# Patient Record
Sex: Female | Born: 1970
Health system: Southern US, Community
[De-identification: ages and names within clinical notes are randomized; demographics above are authoritative.]

## PROBLEM LIST (undated history)

## (undated) DIAGNOSIS — Z789 Other specified health status: Secondary | ICD-10-CM

## (undated) HISTORY — DX: Other specified health status: Z78.9

## (undated) HISTORY — PX: TUBAL LIGATION: SHX77

## (undated) HISTORY — PX: OTHER SURGICAL HISTORY: SHX169

## (undated) HISTORY — PX: DILATION AND CURETTAGE OF UTERUS: SHX78

---

## 1997-12-21 ENCOUNTER — Ambulatory Visit (HOSPITAL_COMMUNITY): Admission: RE | Admit: 1997-12-21 | Discharge: 1997-12-21 | Payer: Self-pay | Admitting: *Deleted

## 1998-03-17 ENCOUNTER — Ambulatory Visit (HOSPITAL_COMMUNITY): Admission: RE | Admit: 1998-03-17 | Discharge: 1998-03-17 | Payer: Self-pay | Admitting: *Deleted

## 1998-06-14 ENCOUNTER — Inpatient Hospital Stay (HOSPITAL_COMMUNITY): Admission: AD | Admit: 1998-06-14 | Discharge: 1998-06-17 | Payer: Self-pay | Admitting: *Deleted

## 1999-07-07 ENCOUNTER — Emergency Department (HOSPITAL_COMMUNITY): Admission: EM | Admit: 1999-07-07 | Discharge: 1999-07-07 | Payer: Self-pay | Admitting: Emergency Medicine

## 2001-02-04 ENCOUNTER — Emergency Department (HOSPITAL_COMMUNITY): Admission: EM | Admit: 2001-02-04 | Discharge: 2001-02-04 | Payer: Self-pay | Admitting: Emergency Medicine

## 2001-03-19 ENCOUNTER — Emergency Department (HOSPITAL_COMMUNITY): Admission: EM | Admit: 2001-03-19 | Discharge: 2001-03-19 | Payer: Self-pay

## 2001-11-04 ENCOUNTER — Emergency Department (HOSPITAL_COMMUNITY): Admission: EM | Admit: 2001-11-04 | Discharge: 2001-11-04 | Payer: Self-pay | Admitting: Emergency Medicine

## 2001-11-07 ENCOUNTER — Emergency Department (HOSPITAL_COMMUNITY): Admission: EM | Admit: 2001-11-07 | Discharge: 2001-11-07 | Payer: Self-pay | Admitting: *Deleted

## 2001-11-14 ENCOUNTER — Emergency Department (HOSPITAL_COMMUNITY): Admission: EM | Admit: 2001-11-14 | Discharge: 2001-11-14 | Payer: Self-pay | Admitting: Emergency Medicine

## 2003-01-07 ENCOUNTER — Emergency Department (HOSPITAL_COMMUNITY): Admission: EM | Admit: 2003-01-07 | Discharge: 2003-01-07 | Payer: Self-pay | Admitting: Emergency Medicine

## 2003-01-24 ENCOUNTER — Emergency Department (HOSPITAL_COMMUNITY): Admission: EM | Admit: 2003-01-24 | Discharge: 2003-01-24 | Payer: Self-pay | Admitting: Emergency Medicine

## 2005-03-18 ENCOUNTER — Emergency Department (HOSPITAL_COMMUNITY): Admission: EM | Admit: 2005-03-18 | Discharge: 2005-03-18 | Payer: Self-pay | Admitting: Emergency Medicine

## 2005-04-07 ENCOUNTER — Emergency Department (HOSPITAL_COMMUNITY): Admission: EM | Admit: 2005-04-07 | Discharge: 2005-04-07 | Payer: Self-pay | Admitting: Emergency Medicine

## 2007-05-20 ENCOUNTER — Encounter: Admission: RE | Admit: 2007-05-20 | Discharge: 2007-05-21 | Payer: Self-pay | Admitting: Internal Medicine

## 2007-05-21 ENCOUNTER — Encounter: Admission: RE | Admit: 2007-05-21 | Discharge: 2007-06-12 | Payer: Self-pay | Admitting: Internal Medicine

## 2010-05-10 ENCOUNTER — Emergency Department (HOSPITAL_COMMUNITY): Admission: EM | Admit: 2010-05-10 | Discharge: 2010-05-10 | Payer: Self-pay | Admitting: Emergency Medicine

## 2010-05-11 ENCOUNTER — Emergency Department (HOSPITAL_COMMUNITY): Admission: EM | Admit: 2010-05-11 | Discharge: 2010-05-11 | Payer: Self-pay | Admitting: Emergency Medicine

## 2010-10-26 LAB — CBC
HCT: 39.8 % (ref 36.0–46.0)
MCHC: 34 g/dL (ref 30.0–36.0)
Platelets: 278 10*3/uL (ref 150–400)
RDW: 12.8 % (ref 11.5–15.5)
WBC: 8.8 10*3/uL (ref 4.0–10.5)

## 2010-10-26 LAB — DIFFERENTIAL
Basophils Absolute: 0 10*3/uL (ref 0.0–0.1)
Basophils Relative: 1 % (ref 0–1)
Lymphocytes Relative: 28 % (ref 12–46)
Monocytes Absolute: 1 10*3/uL (ref 0.1–1.0)
Neutro Abs: 5.1 10*3/uL (ref 1.7–7.7)
Neutrophils Relative %: 58 % (ref 43–77)

## 2010-10-26 LAB — BASIC METABOLIC PANEL
BUN: 8 mg/dL (ref 6–23)
Calcium: 9.4 mg/dL (ref 8.4–10.5)
Creatinine, Ser: 0.54 mg/dL (ref 0.4–1.2)
GFR calc non Af Amer: >60 mL/min (ref >60)
Glucose, Bld: 95 mg/dL (ref 70–99)

## 2010-10-26 LAB — URINALYSIS, ROUTINE W REFLEX MICROSCOPIC
Bilirubin Urine: NEGATIVE
Ketones, ur: NEGATIVE mg/dL
Nitrite: NEGATIVE
Protein, ur: NEGATIVE mg/dL
Urobilinogen, UA: 1 mg/dL (ref 0.0–1.0)
pH: 6 (ref 5.0–8.0)

## 2010-10-26 LAB — POCT CARDIAC MARKERS
CKMB, poc: <1 ng/mL — ABNORMAL LOW (ref 1.0–8.0)
Troponin i, poc: <0.05 ng/mL (ref 0.00–0.09)

## 2011-12-25 ENCOUNTER — Inpatient Hospital Stay (HOSPITAL_COMMUNITY)
Admission: AD | Admit: 2011-12-25 | Discharge: 2011-12-25 | Disposition: A | Discharge status: Home / Self Care | Payer: Self-pay | Source: Ambulatory Visit | Attending: Obstetrics & Gynecology | Admitting: Obstetrics & Gynecology

## 2011-12-25 ENCOUNTER — Encounter (HOSPITAL_COMMUNITY): Payer: Self-pay | Admitting: *Deleted

## 2011-12-25 ENCOUNTER — Inpatient Hospital Stay (HOSPITAL_COMMUNITY): Payer: Self-pay

## 2011-12-25 DIAGNOSIS — R109 Unspecified abdominal pain: Secondary | ICD-10-CM | POA: Insufficient documentation

## 2011-12-25 DIAGNOSIS — N949 Unspecified condition associated with female genital organs and menstrual cycle: Secondary | ICD-10-CM | POA: Insufficient documentation

## 2011-12-25 DIAGNOSIS — R1084 Generalized abdominal pain: Secondary | ICD-10-CM

## 2011-12-25 HISTORY — DX: Other specified health status: Z78.9

## 2011-12-25 LAB — CBC
HCT: 37.6 % (ref 36.0–46.0)
MCH: 29.4 pg (ref 26.0–34.0)
MCV: 89.7 fL (ref 78.0–100.0)
RBC: 4.19 MIL/uL (ref 3.87–5.11)
RDW: 12.6 % (ref 11.5–15.5)
WBC: 9.7 10*3/uL (ref 4.0–10.5)

## 2011-12-25 LAB — WET PREP, GENITAL
Clue Cells Wet Prep HPF POC: NONE SEEN
Trich, Wet Prep: NONE SEEN
Yeast Wet Prep HPF POC: NONE SEEN

## 2011-12-25 LAB — DIFFERENTIAL
Eosinophils Absolute: 0.2 10*3/uL (ref 0.0–0.7)
Eosinophils Relative: 2 % (ref 0–5)
Lymphocytes Relative: 27 % (ref 12–46)
Lymphs Abs: 2.6 10*3/uL (ref 0.7–4.0)
Monocytes Absolute: 1.1 10*3/uL — ABNORMAL HIGH (ref 0.1–1.0)

## 2011-12-25 LAB — URINALYSIS, ROUTINE W REFLEX MICROSCOPIC
Bilirubin Urine: NEGATIVE
Hgb urine dipstick: NEGATIVE
Ketones, ur: NEGATIVE mg/dL
Specific Gravity, Urine: 1.025 (ref 1.005–1.030)
Urobilinogen, UA: 0.2 mg/dL (ref 0.0–1.0)

## 2011-12-25 MED ORDER — DICLOFENAC SODIUM 75 MG PO TBEC: 75.0000 mg | DELAYED_RELEASE_TABLET | Freq: Two times a day (BID) | ORAL | Status: DC

## 2011-12-25 MED ORDER — IBUPROFEN 800 MG PO TABS
800.0000 mg | ORAL_TABLET | Freq: Once | ORAL | Status: AC
  Administered 2011-12-25: 800 mg via ORAL
  Filled 2011-12-25: qty 1

## 2011-12-25 NOTE — MAU Provider Note (Addendum)
History     CSN: [id number]  Arrival date & time 12/25/11  1724   None     Chief Complaint  Patient presents with  . Abdominal Pain    HPI Jaclyn Wright is a 41 y.o. female who presents to MAU for lower abdominal pain that has been off and on x 3 weeks. Has gotten worse the past few days on the right side. Denies vaginal bleeding, no nausea or vomiting. BTL 13 years ago and then in December 2012 had tubal reversal because she had so much abdominal bloating and cramping pain. Done in [place], [place]. No birth control at this time. No period since Feb.  Last pap smear 3 years ago and was normal. No regular GYN since Dr. Wiliam Ke passed away. Current sex partner x 14 years. No history of STI's. The history was provided by the patient.   Past Medical History  Diagnosis Date  . No pertinent past medical history     Past Surgical History  Procedure Date  . Cesarean section   . Tubal ligation   . Tubal reversal     Family History  Problem Relation Age of Onset  . Anesthesia problems Neg Hx     History  Substance Use Topics  . Smoking status: Former Games developer  . Smokeless tobacco: Not on file   Comment: quit with pregnancy  . Alcohol Use: No    OB History    Grav Para Term Preterm Abortions TAB SAB Ect Mult Living   5 4 4  0 1 1 0 0 0 4      Review of Systems  Constitutional: Negative for fever, chills, diaphoresis and fatigue.  HENT: Negative for ear pain, congestion, sore throat, facial swelling, neck pain, neck stiffness, dental problem and sinus pressure.   Eyes: Negative for photophobia, pain, discharge and visual disturbance.  Respiratory: Negative for cough, chest tightness and wheezing.   Cardiovascular: Negative.  Negative for chest pain and leg swelling.  Gastrointestinal: Positive for abdominal pain and constipation. Negative for nausea, vomiting, diarrhea and abdominal distention.  Genitourinary: Positive for frequency. Negative for dysuria, urgency, flank pain, vaginal  bleeding, vaginal discharge and difficulty urinating.  Musculoskeletal: Positive for back pain. Negative for myalgias and gait problem.  Skin: Negative for color change and rash.  Neurological: Positive for headaches. Negative for dizziness, speech difficulty, weakness, light-headedness and numbness.  Psychiatric/Behavioral: Negative for confusion and agitation. The patient is not nervous/anxious.     Allergies  Review of patient's allergies indicates no known allergies.  Home Medications  No current outpatient prescriptions on file.  BP 135/85  Pulse 97  Temp(Src) 98.4 F (36.9 C) (Oral)  Resp 20  Ht 5' 5.5" (1.664 m)  Wt 240 lb 12.8 oz (109.226 kg)  BMI 39.46 kg/m2  SpO2 100%  LMP 09/22/2011  Physical Exam  Nursing note and vitals reviewed. Constitutional: She is oriented to person, place, and time. She appears well-developed and well-nourished. No distress.  HENT:  Head: Normocephalic.  Eyes: EOM are normal.  Neck: Neck supple.  Cardiovascular: Normal rate.   Pulmonary/Chest: Effort normal.  Abdominal: Soft. There is no tenderness.  Genitourinary:       External genitalia without lesions. White discharge vaginal vault. No CMT, right adnexal tenderness. Unable to palpate uterus due to patient habitus.  Musculoskeletal: Normal range of motion.  Neurological: She is alert and oriented to person, place, and time. No cranial nerve deficit.  Skin: Skin is warm and dry.  Psychiatric: She  has a normal mood and affect. Her behavior is normal. Judgment and thought content normal.   Results for orders placed during the hospital encounter of 12/25/11 (from the past 24 hour(s))  URINALYSIS, ROUTINE W REFLEX MICROSCOPIC     Status: Normal   Collection Time   12/25/11  5:45 PM      Component Value Range   Color, Urine YELLOW  YELLOW    APPearance CLEAR  CLEAR    Specific Gravity, Urine 1.025  1.005 - 1.030    pH 6.0  5.0 - 8.0    Glucose, UA NEGATIVE  NEGATIVE (mg/dL)   Hgb  urine dipstick NEGATIVE  NEGATIVE    Bilirubin Urine NEGATIVE  NEGATIVE    Ketones, ur NEGATIVE  NEGATIVE (mg/dL)   Protein, ur NEGATIVE  NEGATIVE (mg/dL)   Urobilinogen, UA 0.2  0.0 - 1.0 (mg/dL)   Nitrite NEGATIVE  NEGATIVE    Leukocytes, UA NEGATIVE  NEGATIVE   POCT PREGNANCY, URINE     Status: Normal   Collection Time   12/25/11  5:59 PM      Component Value Range   Preg Test, Ur NEGATIVE  NEGATIVE   CBC     Status: Normal   Collection Time   12/25/11  6:40 PM      Component Value Range   WBC 9.7  4.0 - 10.5 (K/uL)   RBC 4.19  3.87 - 5.11 (MIL/uL)   Hemoglobin 12.3  12.0 - 15.0 (g/dL)   HCT 44.0  34.7 - 42.5 (%)   MCV 89.7  78.0 - 100.0 (fL)   MCH 29.4  26.0 - 34.0 (pg)   MCHC 32.7  30.0 - 36.0 (g/dL)   RDW 95.6  38.7 - 56.4 (%)   Platelets 278  150 - 400 (K/uL)  DIFFERENTIAL     Status: Abnormal   Collection Time   12/25/11  6:40 PM      Component Value Range   Neutrophils Relative 60  43 - 77 (%)   Neutro Abs 5.8  1.7 - 7.7 (K/uL)   Lymphocytes Relative 27  12 - 46 (%)   Lymphs Abs 2.6  0.7 - 4.0 (K/uL)   Monocytes Relative 11  3 - 12 (%)   Monocytes Absolute 1.1 (*) 0.1 - 1.0 (K/uL)   Eosinophils Relative 2  0 - 5 (%)   Eosinophils Absolute 0.2  0.0 - 0.7 (K/uL)   Basophils Relative 0  0 - 1 (%)   Basophils Absolute 0.0  0.0 - 0.1 (K/uL)  HCG, QUANTITATIVE, PREGNANCY     Status: Normal   Collection Time   12/25/11  6:40 PM      Component Value Range   hCG, Beta Chain, Quant, S <1  <5 (mIU/mL)  WET PREP, GENITAL     Status: Abnormal   Collection Time   12/25/11  7:25 PM      Component Value Range   Yeast Wet Prep HPF POC NONE SEEN  NONE SEEN    Trich, Wet Prep NONE SEEN  NONE SEEN    Clue Cells Wet Prep HPF POC NONE SEEN  NONE SEEN    WBC, Wet Prep HPF POC FEW (*) NONE SEEN    Assessment: Abdominal pain/pelvic pain  Plan:  Care turned over to [facility], CNM @ 20:00 pm. Patient awaiting ultrasound. ED Course  Procedures  RADIOLOGY REPORT*  Clinical  Data: Pelvic pain, right side greater than left. Right  adnexal tenderness. Amenorrhea. Negative pregnancy test.  TRANSABDOMINAL AND TRANSVAGINAL ULTRASOUND OF PELVIS  Technique: Both transabdominal and transvaginal ultrasound  examinations of the pelvis were performed. Transabdominal  technique was performed for global imaging of the pelvis including  uterus, ovaries, adnexal regions, and pelvic cul-de-sac.  It was necessary to proceed with endovaginal exam following the  transabdominal exam to visualize the endometrium and ovaries.  Comparison: None.  Findings:  Uterus: 9.4 x 5.3 x 6.2 cm. No fibroids or other uterine mass  identified. Prior C-section scar noted.  Endometrium: Double layer thickness measures 12 mm transvaginally.  Normal appearance.  Right ovary: 4.7 x 3.2 x 3.2 cm. Normal appearance. No ovarian or  adnexal mass identified.  Left ovary: 3.3 x 2.0 x 2.2 cm. Normal appearance. No ovarian or  adnexal mass identified.  Other Findings: No free fluid  IMPRESSION:  Normal study. No evidence of pelvic mass or other significant  abnormality.  Original Report Authenticated By: Danae Orleans, M.D.  MDM   Pelvic Pain  Discharge home Rx Diclofenac prn FU with Gyn Provider of your choice [facility] E.  10:03 PM 12/25/2011

## 2011-12-25 NOTE — MAU Note (Signed)
Pt in c/o right sided lower abdominal cramping pain.  Had tubal reversal in December 2012 followed by 2 normal periods.  Has not had period since February 9th.  Reports an increased clear vaginal discharge without odor.  Taken multiple preg tests at home, all negative.

## 2011-12-25 NOTE — MAU Provider Note (Signed)
Attestation of Attending Supervision of Advanced Practitioner: Evaluation and management procedures were performed by the OB Fellow/PA/CNM/NP under my supervision and collaboration. Chart reviewed, and agree with management and plan.  Carollee Nussbaumer, M.D. 12/25/2011 10:38 PM   

## 2011-12-25 NOTE — MAU Provider Note (Signed)
Attestation of Attending Supervision of Advanced Practitioner: Evaluation and management procedures were performed by the [facility] Fellow/PA/CNM/NP under my supervision and collaboration. Chart reviewed, and agree with management and plan.  Jaynie Collins, M.D. 12/25/2011 8:37 PM

## 2011-12-25 NOTE — MAU Note (Signed)
Patient states she had her tubes tied during a cesarean section 13 years ago. On December 21-2012 due to having multiple problems after her tubes were tied she had a tubal reversal in [place], [place]. . Has not had a period since 2-9 and has had negative home pregnancy tests. Has had right side pain on and off for about one month. No bleeding but a heavier than usual discharge.

## 2011-12-25 NOTE — Discharge Instructions (Signed)
Abdominal Pain Abdominal pain can be caused by many things. Your caregiver decides the seriousness of your pain by an examination and possibly blood tests and X-rays. Many cases can be observed and treated at home. Most abdominal pain is not caused by a disease and will probably improve without treatment. However, in many cases, more time must pass before a clear cause of the pain can be found. Before that point, it may not be known if you need more testing, or if hospitalization or surgery is needed. HOME CARE INSTRUCTIONS   Do not take laxatives unless directed by your caregiver.   Take pain medicine only as directed by your caregiver.   Only take over-the-counter or prescription medicines for pain, discomfort, or fever as directed by your caregiver.   Try a clear liquid diet (broth, tea, or water) for as long as directed by your caregiver. Slowly move to a bland diet as tolerated.  SEEK IMMEDIATE MEDICAL CARE IF:   The pain does not go away.   You have a fever.   You keep throwing up (vomiting).   The pain is felt only in portions of the abdomen. Pain in the right side could possibly be appendicitis. In an adult, pain in the left lower portion of the abdomen could be colitis or diverticulitis.   You pass bloody or black tarry stools.  MAKE SURE YOU:   Understand these instructions.   Will watch your condition.   Will get help right away if you are not doing well or get worse.  Document Released: 05/09/2005 Document Revised: 07/19/2011 Document Reviewed: 03/17/2008 [facility] Patient Information 2012 [place], [place]. RESOURCE GUIDE  Dental Problems  Patients with Medicaid: [facility]                     [phone] [address]                                           Phone:  [phone]                                                  If unable to pay or uninsured, contact:  [facility] or [facility]. to become qualified for the adult dental  clinic.  Chronic Pain Problems Contact [facility]  [phone] Patients need to be referred by their primary care doctor.  Insufficient Money for Medicine Contact [employer] Way:  call "211" or [facility] [phone].  No Primary Care Doctor Call [facility]  [phone] Other agencies that provide inexpensive medical care    [facility]  [phone]    [facility]  [phone]    [facility]  [phone]    [facility]  [phone]    [facility]  [phone]    [facility]  [phone]  Substance Abuse Resources [facility]  ([phone] [facility] [phone] [facility] [place] [phone] Floydene Flock [phone] Residential & Outpatient Substance Abuse Program  [phone]  Psychological Services [facility]  [phone] [facility]  [phone] [facility]   [phone] (emergency services [phone])  Abuse/Neglect [facility] Child Abuse Hotline (505)495-9742 [facility]  Child Abuse Hotline (304)261-8281 (After Hours)  Emergency Shelter [facility] Minist[facility] 908-594-5259  M[facility] [phone]) [phone] [facility] [phone]  MRSA Hotline #:   754 583 3375    [facility] Resources  [facility]  [employer]                           [facility] Dept. [address] [place]                     [address]         [address] [place] [address]  Blondell Reveal Phone:  [phone]                                  Phone:  [phone]                   Phone:  [phone]  [facility] Phone:  [phone]  [facility] Child Abuse Hotline (671)208-8492 601-596-5778 (After Hours) [facility]    [facility]  [phone]     Phone: [phone]          [facility]                      [facility]  @[place]     Phone: [phone]  Phone: [phone]         [facility]     Phone: [phone]  Phone: [phone]           [facility] [place]                hone: [phone]  Phone: [phone]         [facility] [address]ise Ceo      Phone: [phone]  Phone: [phone]         [facility] Dept.                Phone: [phone]  Women's Health   Phone:[phone]    Family [address] [place])          Phone: [phone] [facility]   Phone: [phone]

## 2012-03-24 ENCOUNTER — Emergency Department (HOSPITAL_COMMUNITY): Payer: Self-pay

## 2012-03-24 ENCOUNTER — Emergency Department (HOSPITAL_COMMUNITY)
Admission: EM | Admit: 2012-03-24 | Discharge: 2012-03-24 | Disposition: A | Discharge status: Home / Self Care | Payer: Self-pay | Attending: Emergency Medicine | Admitting: Emergency Medicine

## 2012-03-24 ENCOUNTER — Encounter (HOSPITAL_COMMUNITY): Payer: Self-pay | Admitting: Emergency Medicine

## 2012-03-24 DIAGNOSIS — R1031 Right lower quadrant pain: Secondary | ICD-10-CM | POA: Insufficient documentation

## 2012-03-24 DIAGNOSIS — Z87891 Personal history of nicotine dependence: Secondary | ICD-10-CM | POA: Insufficient documentation

## 2012-03-24 DIAGNOSIS — N898 Other specified noninflammatory disorders of vagina: Secondary | ICD-10-CM | POA: Insufficient documentation

## 2012-03-24 DIAGNOSIS — R109 Unspecified abdominal pain: Secondary | ICD-10-CM

## 2012-03-24 LAB — URINALYSIS, ROUTINE W REFLEX MICROSCOPIC
Bilirubin Urine: NEGATIVE
Ketones, ur: NEGATIVE mg/dL
Nitrite: NEGATIVE
pH: 5.5 (ref 5.0–8.0)

## 2012-03-24 LAB — CBC WITH DIFFERENTIAL/PLATELET
Lymphocytes Relative: 35 % (ref 12–46)
Lymphs Abs: 2.5 10*3/uL (ref 0.7–4.0)
Neutro Abs: 3.8 10*3/uL (ref 1.7–7.7)
Neutrophils Relative %: 53 % (ref 43–77)
Platelets: 310 10*3/uL (ref 150–400)
RBC: 4.34 MIL/uL (ref 3.87–5.11)
WBC: 7.2 10*3/uL (ref 4.0–10.5)

## 2012-03-24 LAB — COMPREHENSIVE METABOLIC PANEL
ALT: 11 U/L (ref 0–35)
Alkaline Phosphatase: 56 U/L (ref 39–117)
CO2: 25 mEq/L (ref 19–32)
Chloride: 104 mEq/L (ref 96–112)
GFR calc Af Amer: >90 mL/min (ref >90)
Glucose, Bld: 96 mg/dL (ref 70–99)
Potassium: 3.9 mEq/L (ref 3.5–5.1)
Sodium: 138 mEq/L (ref 135–145)
Total Protein: 7.7 g/dL (ref 6.0–8.3)

## 2012-03-24 LAB — WET PREP, GENITAL: Trich, Wet Prep: NONE SEEN

## 2012-03-24 MED ORDER — SODIUM CHLORIDE 0.9 % IV SOLN
1000.0000 mL | INTRAVENOUS | Status: DC
  Administered 2012-03-24: 1000 mL via INTRAVENOUS

## 2012-03-24 MED ORDER — METRONIDAZOLE 500 MG PO TABS: 500.0000 mg | ORAL_TABLET | Freq: Two times a day (BID) | ORAL | Status: AC

## 2012-03-24 MED ORDER — IBUPROFEN 800 MG PO TABS
800.0000 mg | ORAL_TABLET | Freq: Once | ORAL | Status: AC
  Administered 2012-03-24: 800 mg via ORAL
  Filled 2012-03-24: qty 1

## 2012-03-24 MED ORDER — ONDANSETRON HCL 4 MG/2ML IJ SOLN: 4.0000 mg | Freq: Once | INTRAMUSCULAR | Status: DC

## 2012-03-24 MED ORDER — IOHEXOL 300 MG/ML  SOLN
100.0000 mL | Freq: Once | INTRAMUSCULAR | Status: AC | PRN
  Administered 2012-03-24: 100 mL via INTRAVENOUS

## 2012-03-24 NOTE — ED Notes (Signed)
Patient transported to CT 

## 2012-03-24 NOTE — ED Provider Notes (Signed)
History     CSN: [id number]  Arrival date & time 03/24/12  1156   First MD Initiated Contact with Patient 03/24/12 1232      Chief Complaint  Patient presents with  . Abdominal Pain    (Consider location/radiation/quality/duration/timing/severity/associated sxs/prior treatment) Patient is a 41 y.o. female presenting with abdominal pain. The history is provided by the patient. No language interpreter was used.  Abdominal Pain The primary symptoms of the illness include abdominal pain, fever and vaginal discharge. The primary symptoms of the illness do not include shortness of breath, vomiting, diarrhea, dysuria or vaginal bleeding. The current episode started more than 2 days ago. The onset of the illness was gradual.  The vaginal discharge is not associated with dysuria.  Symptoms associated with the illness do not include urgency, hematuria or back pain.   41 year old female coming in today with right lower quadrant abdominal pain times several months intermittently. Patient states that she went to [facility] hospital in May and had an ultrasound which was unremarkable. 3 weeks ago she went to Dr. blunt to put her on some kind of antibiotic for a vaginal discharge. States that she had positive yeast and positive bacterial infection and a "salty taste" according to her partner down in her privates.  Patient states that she did not have any STDs and her partner does not either. States that she has had a vaginal discharge that is clear. Past medical history of a tubal reversal in December. Patient does want to be pregnant she is 41 years old. Last period was 02/24/2012. Patient has one partner x13 years. States she may have had some fevers intermittently as well. She has taken Advil with some relief. States that she has nausea but no vomiting and no diarrhea.  Past Medical History  Diagnosis Date  . No pertinent past medical history     Past Surgical History  Procedure Date  . Cesarean  section   . Tubal ligation   . Tubal reversal     Family History  Problem Relation Age of Onset  . Anesthesia problems Neg Hx     History  Substance Use Topics  . Smoking status: Former Games developer  . Smokeless tobacco: Not on file   Comment: quit with pregnancy  . Alcohol Use: No    OB History    Grav Para Term Preterm Abortions TAB SAB Ect Mult Living   5 4 4  0 1 1 0 0 0 4      Review of Systems  Constitutional: Positive for fever.       Subjective fever  Respiratory: Negative for shortness of breath.   Gastrointestinal: Positive for abdominal pain. Negative for vomiting and diarrhea.  Genitourinary: Positive for vaginal discharge. Negative for dysuria, urgency, hematuria, flank pain, vaginal bleeding, difficulty urinating, vaginal pain and pelvic pain.  Musculoskeletal: Negative for back pain and gait problem.  All other systems reviewed and are negative.    Allergies  Review of patient's allergies indicates no known allergies.  Home Medications   Current Outpatient Rx  Name Route Sig Dispense Refill  . BC HEADACHE POWDER PO Oral Take 1 packet by mouth 3 (three) times daily as needed. For headache or pain    . IBUPROFEN 200 MG PO TABS Oral Take 200 mg by mouth every 6 (six) hours as needed. pain      BP 147/78  Pulse 82  Temp 98.9 F (37.2 C) (Oral)  Resp 20  SpO2 99%  LMP 02/27/2012  Physical Exam  Nursing note and vitals reviewed. Constitutional: She is oriented to person, place, and time. She appears well-developed and well-nourished.  HENT:  Head: Normocephalic and atraumatic.  Eyes: Conjunctivae and EOM are normal. Pupils are equal, round, and reactive to light.  Neck: Normal range of motion. Neck supple.  Cardiovascular: Normal rate, regular rhythm, normal heart sounds and intact distal pulses.   Pulmonary/Chest: Effort normal and breath sounds normal.  Abdominal: Soft. Bowel sounds are normal.  Genitourinary: Uterus normal. Pelvic exam was  performed with patient supine. Cervix exhibits discharge. Cervix exhibits no motion tenderness and no friability. Right adnexum displays no mass, no tenderness and no fullness. Left adnexum displays no mass, no tenderness and no fullness. No erythema, tenderness or bleeding around the vagina. No foreign body around the vagina. Vaginal discharge found.  Musculoskeletal: Normal range of motion. She exhibits no edema and no tenderness.  Neurological: She is alert and oriented to person, place, and time. She has normal reflexes.  Skin: Skin is warm and dry.  Psychiatric: She has a normal mood and affect.    ED Course  Pelvic exam Date/Time: 03/24/2012 3:52 AM Performed by: Remi Haggard Authorized by: Remi Haggard Consent: Verbal consent obtained. Written consent not obtained. Risks and benefits: risks, benefits and alternatives were discussed Consent given by: patient Patient understanding: patient states understanding of the procedure being performed Patient identity confirmed: verbally with patient, arm band, provided demographic data and hospital-assigned identification number Time out: Immediately prior to procedure a "time out" was called to verify the correct patient, procedure, equipment, support staff and site/side marked as required. Preparation: Patient was prepped and draped in the usual sterile fashion. Local anesthesia used: no Patient sedated: no Patient tolerance: Patient tolerated the procedure well with no immediate complications.   (including critical care time)  Labs Reviewed  URINALYSIS, ROUTINE W REFLEX MICROSCOPIC - Abnormal; Notable for the following:    APPearance CLOUDY (*)     All other components within normal limits  POCT PREGNANCY, URINE  CBC WITH DIFFERENTIAL  COMPREHENSIVE METABOLIC PANEL  WET PREP, GENITAL  GC/CHLAMYDIA PROBE AMP, GENITAL   No results found.   No diagnosis found.    MDM  RLQ pain with vaginal discharge.  Will Treat for BV.  CT  abdomen unremarkable.  Patient will follow up at pcp gyn of choice. Ibuprofen for pain.       Remi Haggard, NP 03/25/12 [phone]

## 2012-03-24 NOTE — ED Notes (Signed)
Pt presenting to ed with c/o abdominal pain x months pt states pain is worse today. Pt states positive nausea no vomiting pt states last normal bowel movement yesterday. Pt denies diarrhea at this time

## 2012-03-25 LAB — GC/CHLAMYDIA PROBE AMP, GENITAL
Chlamydia, DNA Probe: NEGATIVE
GC Probe Amp, Genital: NEGATIVE

## 2012-03-27 NOTE — ED Provider Notes (Signed)
Medical screening examination/treatment/procedure(s) were conducted as a shared visit with non-physician practitioner(s) and myself.  I personally evaluated the patient during the encounter  Derwood Kaplan, MD 03/27/12 1710

## 2013-10-01 IMAGING — CT CT ABD-PELV W/ CM
1 of 3 series · 13 of 32 positions shown, 18 images · IV contrast (OMNIPAQUE 300)
Comparison: Pelvic ultrasound - 12/25/2011

CLINICAL DATA: Abdominal pain for several months, worse today with
nausea

CT ABDOMEN AND PELVIS WITH CONTRAST
TECHNIQUE: Multidetector CT imaging of the abdomen and pelvis was
performed following the standard protocol during bolus
administration of intravenous contrast.
Contrast: 100mL OMNIPAQUE IOHEXOL 300 MG/ML  SOLN

[Series 2: abd/pel with · axial · 0.74mm/px · z∈[-495,-110]mm · 13 of 87 slices shown, 18 images]
[im 5/87  soft-tissue]
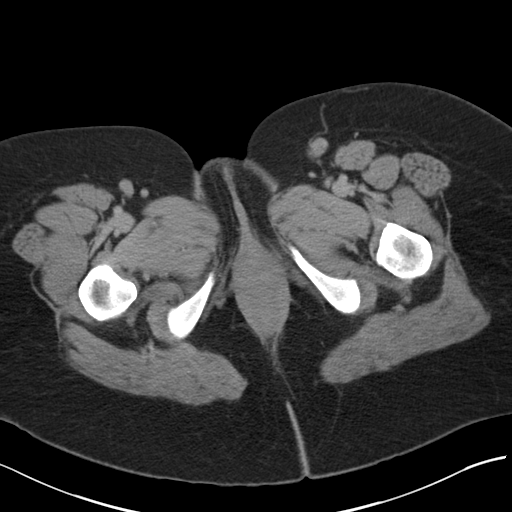
[im 5/87  bone]
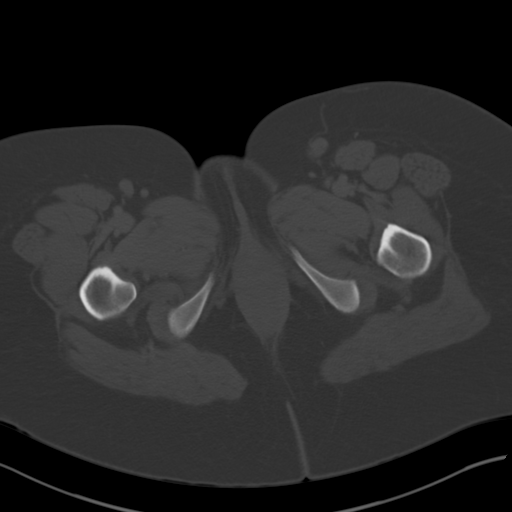
[im 15/87  soft-tissue]
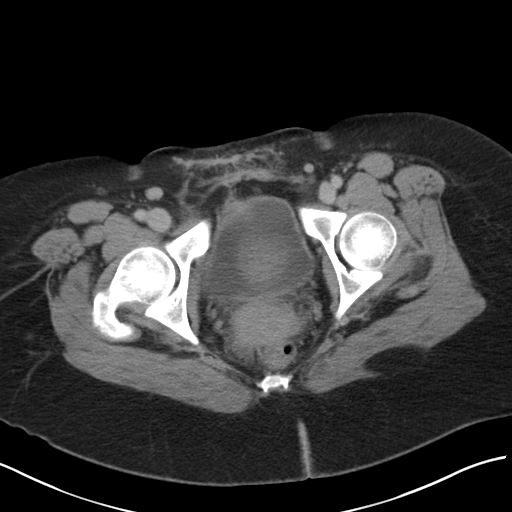
[im 20/87  soft-tissue]
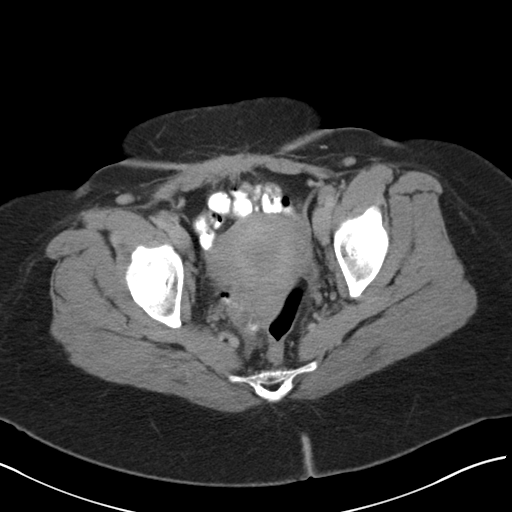
[im 24/87  soft-tissue]
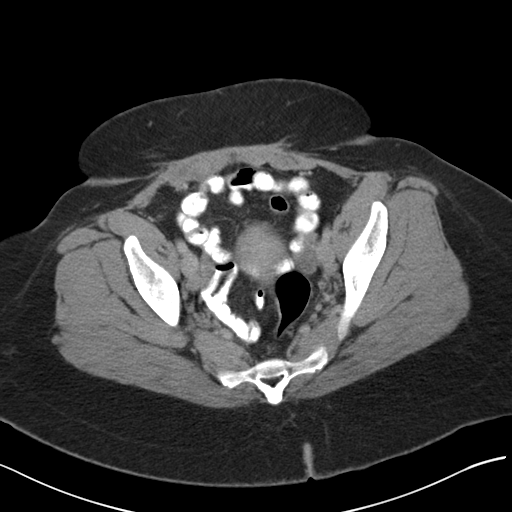
[im 34/87  soft-tissue]
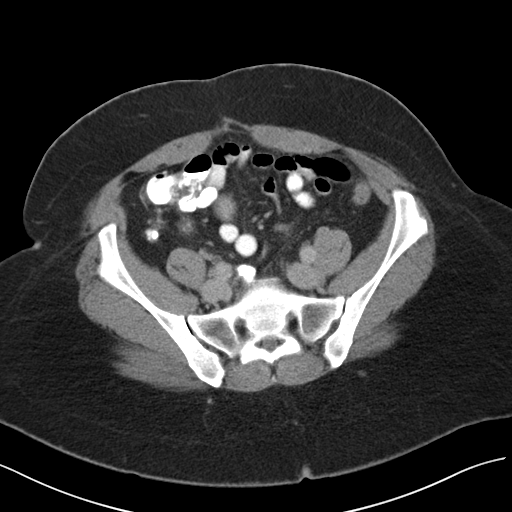
[im 39/87  soft-tissue]
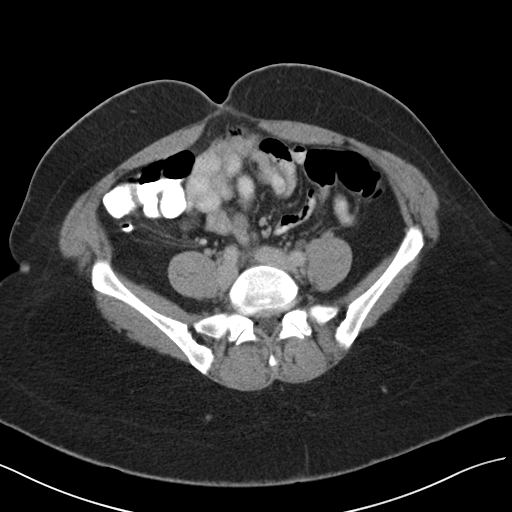
[im 48/87  soft-tissue]
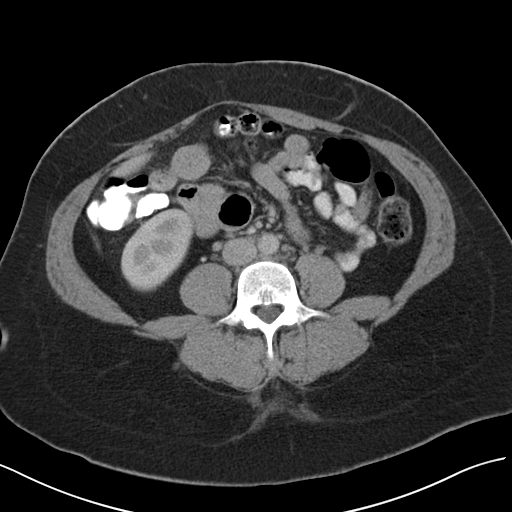
[im 53/87  soft-tissue]
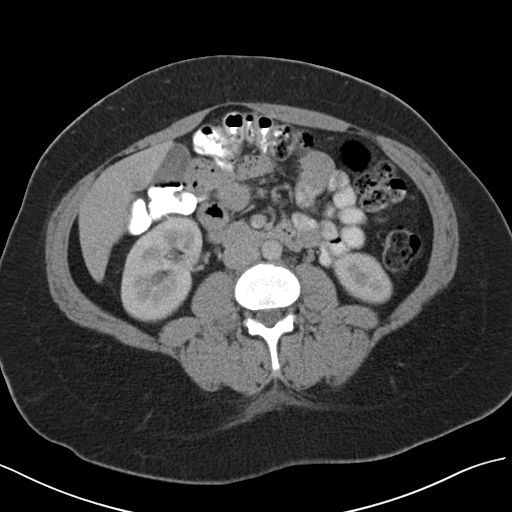
[im 63/87  soft-tissue]
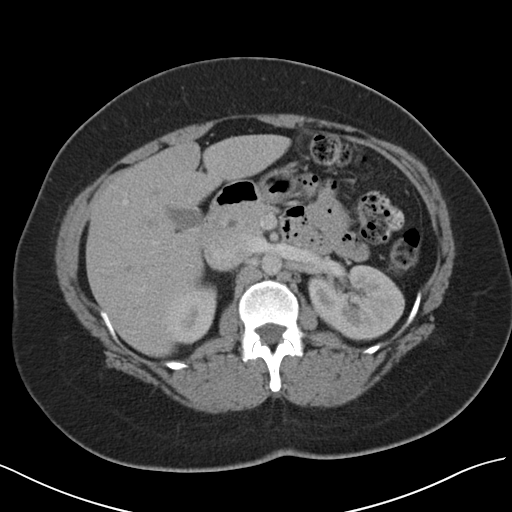
[im 63/87  bone]
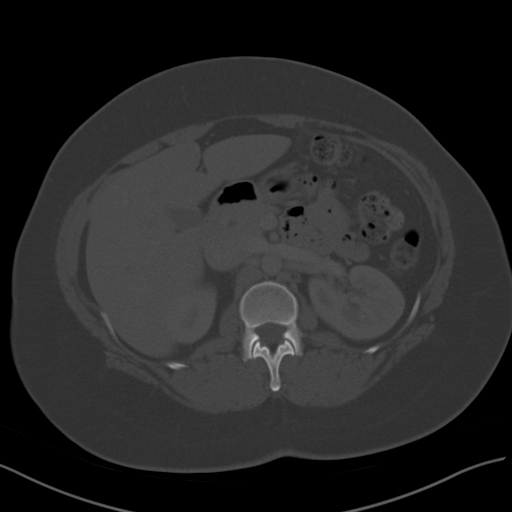
[im 67/87  soft-tissue]
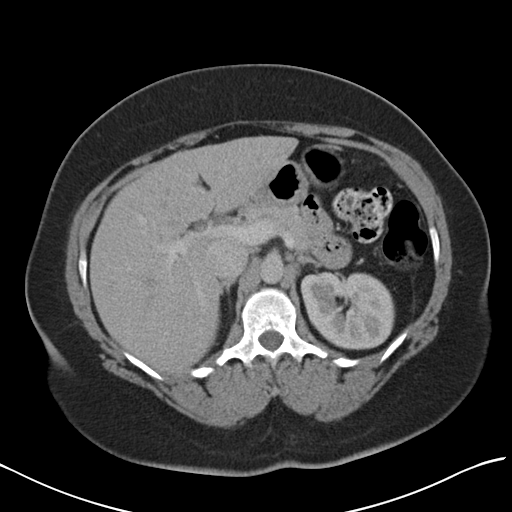
[im 67/87  lung]
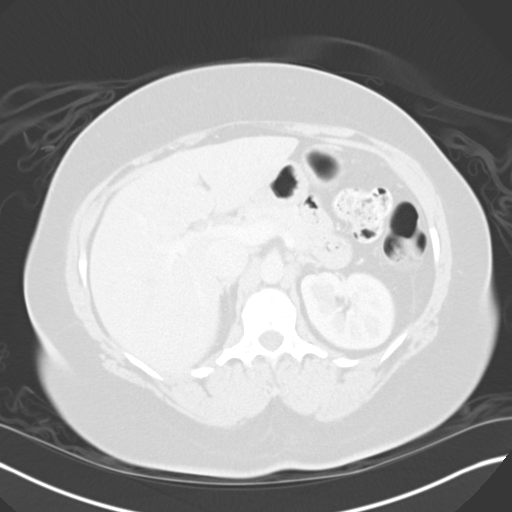
[im 72/87  soft-tissue]
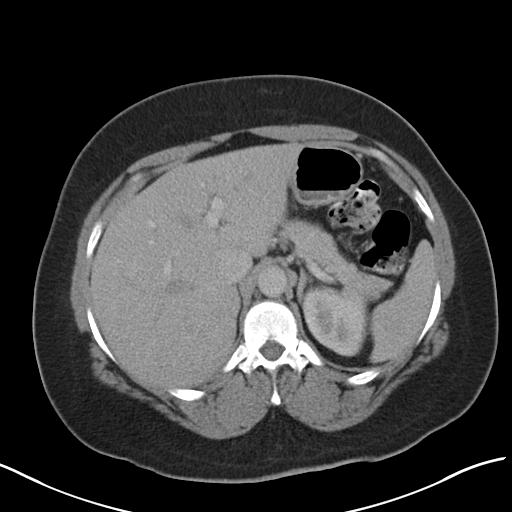
[im 72/87  lung]
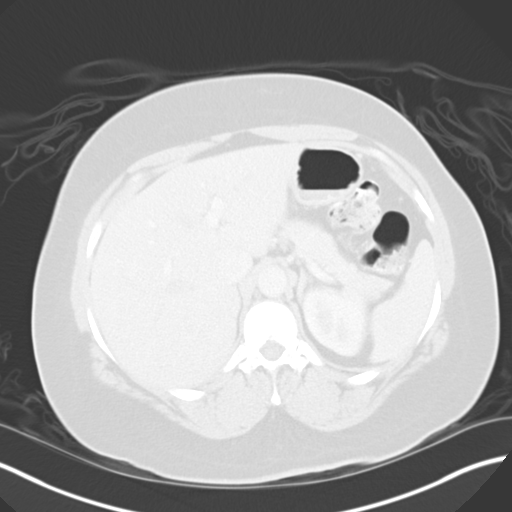
[im 77/87  lung]
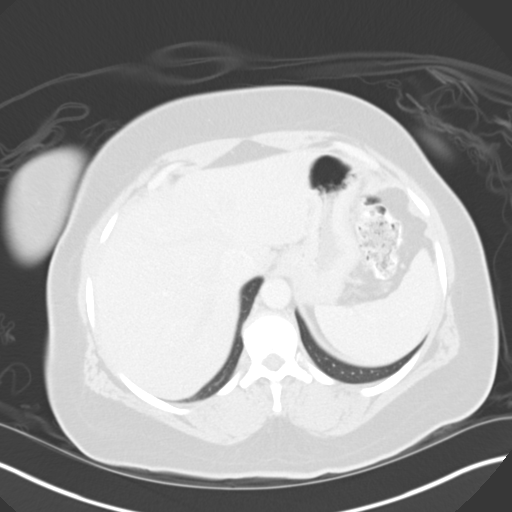
[im 82/87  soft-tissue]
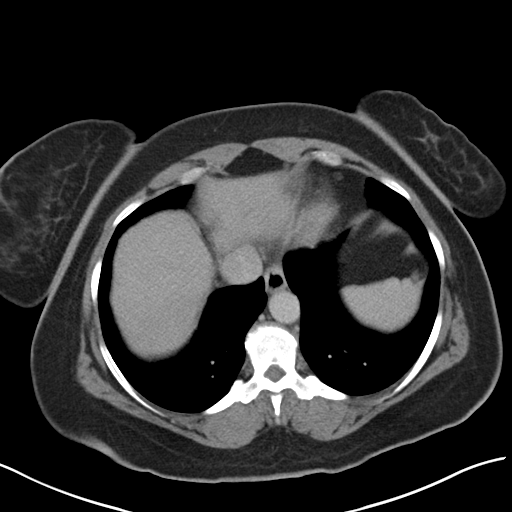
[im 82/87  lung]
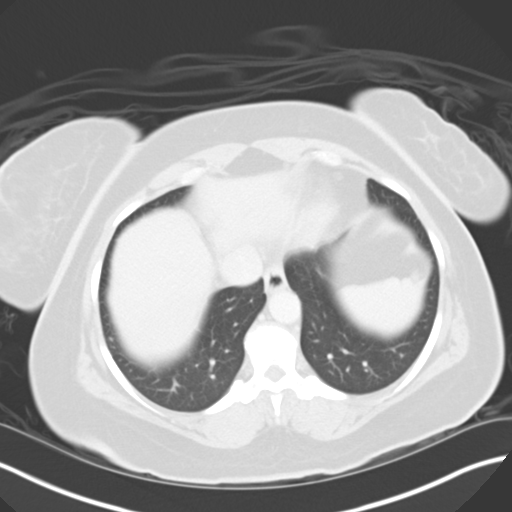

[13 of 32 positions shown; findings below may reference images not displayed]

FINDINGS: Normal hepatic contour.  No discrete hepatic lesions.  Normal
appearance of the gallbladder.  No ascites.  There is symmetric
enhancement and excretion of the bilateral kidneys.  No discrete
renal stones on this postcontrast examination.  No urinary
obstruction or perinephric stranding.  Normal appearance of the
bilateral adrenal glands, pancreas and spleen.

Ingested enteric contrast extends to the level of the transverse
colon.  No evidence of enteric obstruction.  Scattered minimal
colonic diverticulosis without evidence of diverticulitis.  The
bowel is otherwise normal in course and caliber without wall
thickening or evidence of obstruction.  Normal appearance of the
retrocecal appendix.  No pneumoperitoneum, pneumatosis or portal
venous gas.

Normal caliber of the abdominal aorta.  The major branch vessels of
the abdominal aorta are patent.  Scattered shoddy retroperitoneal
lymph nodes are not enlarged by CT criteria.

No retroperitoneal, or mesenteric adenopathy. Grossly symmetric
bilateral external iliac chain and inguinal lymph nodes are
presumably reactive in etiology.  This finding is associated with
minimal stranding within the undersurface of the patient's pannus.

There is a minimal amount of free fluid within endometrial canal,
presumably physiologic.  No discrete adnexal lesion.  No free fluid
in the pelvis.

Limited visualization of the lower thorax is negative for focal
airspace opacity or pleural effusion.  Normal heart size.  No
pericardial effusion.  Incidental note is made of a prominent
pericardial lymph node measuring 5 mm in short axis diameter (image
6, series 2), likely reactive.

No acute or aggressive osseous abnormalities.
IMPRESSION: 1.  No explanation for patient's abdominal pain.  Specifically, no
evidence of enteric or urinary obstruction.
2.  Colonic diverticulosis without evidence of diverticulitis.

## 2013-12-16 ENCOUNTER — Emergency Department (INDEPENDENT_AMBULATORY_CARE_PROVIDER_SITE_OTHER)
Admission: EM | Admit: 2013-12-16 | Discharge: 2013-12-16 | Disposition: A | Discharge status: Home / Self Care | Payer: 59 | Source: Home / Self Care | Attending: Emergency Medicine | Admitting: Emergency Medicine

## 2013-12-16 ENCOUNTER — Encounter (HOSPITAL_COMMUNITY): Payer: Self-pay | Admitting: Emergency Medicine

## 2013-12-16 DIAGNOSIS — J209 Acute bronchitis, unspecified: Secondary | ICD-10-CM

## 2013-12-16 DIAGNOSIS — J019 Acute sinusitis, unspecified: Secondary | ICD-10-CM

## 2013-12-16 DIAGNOSIS — J069 Acute upper respiratory infection, unspecified: Secondary | ICD-10-CM

## 2013-12-16 MED ORDER — AMOXICILLIN 500 MG PO CAPS: 1000.0000 mg | ORAL_CAPSULE | Freq: Three times a day (TID) | ORAL | Status: DC

## 2013-12-16 MED ORDER — IPRATROPIUM BROMIDE 0.06 % NA SOLN
2.0000 | Freq: Four times a day (QID) | NASAL | Status: DC
Start: 2013-12-16 — End: 2017-02-15

## 2013-12-16 MED ORDER — OLOPATADINE HCL 0.2 % OP SOLN
OPHTHALMIC | Status: DC
Start: 2013-12-16 — End: 2017-02-15

## 2013-12-16 MED ORDER — ALBUTEROL SULFATE HFA 108 (90 BASE) MCG/ACT IN AERS
2.0000 | INHALATION_SPRAY | Freq: Four times a day (QID) | RESPIRATORY_TRACT | Status: DC
  Administered 2013-12-16: 2 via RESPIRATORY_TRACT

## 2013-12-16 MED ORDER — ALBUTEROL SULFATE HFA 108 (90 BASE) MCG/ACT IN AERS
INHALATION_SPRAY | RESPIRATORY_TRACT | Status: AC
  Filled 2013-12-16: qty 6.7

## 2013-12-16 MED ORDER — GUAIFENESIN-CODEINE 100-10 MG/5ML PO SYRP: 10.0000 mL | ORAL_SOLUTION | Freq: Four times a day (QID) | ORAL | Status: DC | PRN

## 2013-12-16 MED ORDER — PREDNISONE 20 MG PO TABS: 20.0000 mg | ORAL_TABLET | Freq: Two times a day (BID) | ORAL | Status: DC

## 2013-12-16 NOTE — ED Provider Notes (Signed)
Chief Complaint   Chief Complaint  Patient presents with  . URI    History of Present Illness   Jaclyn Wright is a 43 year old female who has had a 2-1/2 week history of a cough productive of white sputum, wheezing, chest tightness, pain in her chest and back, scratchy throat, hoarseness, subjective fever at onset of symptoms, sweats, itching, red eyes, nasal congestion, rhinorrhea, headache, sinus pressure, and ear congestion. She denies any GI complaints. She has had no known sick exposures.  Review of Systems   Other than as noted above, the patient denies any of the following symptoms: Systemic:  No fevers, chills, sweats, or myalgias. Eye:  No redness or discharge. ENT:  No ear pain, headache, nasal congestion, drainage, sinus pressure, or sore throat. Neck:  No neck pain, stiffness, or swollen glands. Lungs:  No cough, sputum production, hemoptysis, wheezing, chest tightness, shortness of breath or chest pain. GI:  No abdominal pain, nausea, vomiting or diarrhea.  PMFSH   Past medical history, family history, social history, meds, and allergies were reviewed.   Physical exam   Vital signs:  BP 116/78  Pulse 108  Temp(Src) 98.9 F (37.2 C) (Oral)  Resp 18  SpO2 100%  LMP 10/02/2013 General:  Alert and oriented.  In no distress.  Skin warm and dry. Eye:  No conjunctival injection or drainage. Lids were normal. ENT:  TMs and canals were normal, without erythema or inflammation.  Nasal mucosa was clear and uncongested, without drainage.  Mucous membranes were moist.  Pharynx was clear with no exudate or drainage.  There were no oral ulcerations or lesions. Neck:  Supple, no adenopathy, tenderness or mass. Lungs:  No respiratory distress.  Lungs were clear to auscultation, without wheezes, rales or rhonchi.  Breath sounds were clear and equal bilaterally.  Heart:  Regular rhythm, without gallops, murmers or rubs. Skin:  Clear, warm, and dry, without rash or lesions.    Assessment     The primary encounter diagnosis was Viral URI. Diagnoses of Acute bronchitis and Acute sinusitis were also pertinent to this visit.  Plan    1.  Meds:  The following meds were prescribed:   Discharge Medication List as of 12/16/2013  5:16 PM    START taking these medications   Details  amoxicillin (AMOXIL) 500 MG capsule Take 2 capsules (1,000 mg total) by mouth 3 (three) times daily., Starting 12/16/2013, Until Discontinued, Normal    guaiFENesin-codeine (GUIATUSS AC) 100-10 MG/5ML syrup Take 10 mLs by mouth 4 (four) times daily as needed for cough., Starting 12/16/2013, Until Discontinued, Print    ipratropium (ATROVENT) 0.06 % nasal spray Place 2 sprays into both nostrils 4 (four) times daily., Starting 12/16/2013, Until Discontinued, Normal    Olopatadine HCl (PATADAY) 0.2 % SOLN 1 drop in each eye daily, Normal    predniSONE (DELTASONE) 20 MG tablet Take 1 tablet (20 mg total) by mouth 2 (two) times daily., Starting 12/16/2013, Until Discontinued, Normal       She was given a sample of albuterol HFA inhaler to take 2 puffs 4 times a day.  2.  Patient Education/Counseling:  The patient was given appropriate handouts, self care instructions, and instructed in symptomatic relief.  Instructed to get extra fluids, rest, and use a cool mist vaporizer.    3.  Follow up:  The patient was told to follow up here if no better in 3 to 4 days, or sooner if becoming worse in any way, and given some  red flag symptoms such as increasing fever, difficulty breathing, chest pain, or persistent vomiting which would prompt immediate return.  Follow up here as needed.      Reuben Likesavid C Uri Turnbough, MD 12/16/13 2132

## 2013-12-16 NOTE — Discharge Instructions (Signed)
Most upper respiratory infections are caused by viruses and do not require antibiotics.  We try to save the antibiotics for when we really need them to prevent bacteria from developing resistance to them.  Here are a few hints about things that can be done at home to help get over an upper respiratory infection quicker: ° °Get extra sleep and extra fluids.  Get 7 to 9 hours of sleep per night and 6 to 8 glasses of water a day.  Getting extra sleep keeps the immune system from getting run down.  Most people with an upper respiratory infection are a little dehydrated.  The extra fluids also keep the secretions liquified and easier to deal with.  Also, get extra vitamin C.  4000 mg per day is the recommended dose. °For the aches, headache, and fever, acetaminophen or ibuprofen are helpful.  These can be alternated every 4 hours.  People with liver disease should avoid large amounts of acetaminophen, and people with ulcer disease, gastroesophageal reflux, gastritis, congestive heart failure, chronic kidney disease, coronary artery disease and the elderly should avoid ibuprofen. °For nasal congestion try Mucinex-D, or if you're having lots of sneezing or clear nasal drainage use Zyrtec-D. People with high blood pressure can take these if their blood pressure is controlled, if not, it's best to avoid the forms with a "D" (decongestants).  You can use the plain Mucinex, Allegra, Claritin, or Zyrtec even if your blood pressure is not controlled.   °A Saline nasal spray such as Ocean Spray can also help.  You can add a decongestant sprays such as Afrin, but you should not use the decongestant sprays for more than 3 or 4 days since they can be habituating.  Breathe Rite nasal strips can also offer a non-drug alternative treatment to nasal congestion, especially at night. °For people with symptoms of sinusitis, sleeping with your head elevated can be helpful.  For sinus pain, moist, hot compresses to the face may provide some  relief.  Many people find that inhaling steam as in a shower or from a pot of steaming water can help. °For any viral infection, zinc containing lozenges such as Cold-Eze or Zicam are helpful.  Zinc helps to fight viral infection.  Hot salt water gargles (8 oz of hot water, 1/2 tsp of table salt, and a pinch of baking soda) can give relief as well as hot beverages such as hot tea.  Sucrets extra strength lozenges will help the sore throat.  °For the cough, take Delsym 2 tsp every 12 hours.  It has also been found recently that Aleve can help control a cough.  The dose is 1 to 2 tablets twice daily with food.  This can be combined with Delsym. (Note, if you are taking ibuprofen, you should not take Aleve as well--take one or the other.) °A cool mist vaporizer will help keep your mucous membranes from drying out.  ° °It's important when you have an upper respiratory infection not to pass the infection to others.  This involves being very careful about the following: ° °Frequent hand washing or use of hand sanitizer, especially after coughing, sneezing, blowing your nose or touching your face, nose or eyes. °Do not shake hands or touch anyone and try to avoid touching surfaces that other people use such as doorknobs, shopping carts, telephones and computer keyboards. °Use tissues and dispose of them properly in a garbage can or ziplock bag. °Cough into your sleeve. °Do not let others eat or   drink after you. ° °It's also important to recognize the signs of serious illness and get evaluated if they occur: °Any respiratory infection that lasts more than 7 to 10 days.  Yellow nasal drainage and sputum are not reliable indicators of a bacterial infection, but if they last for more than 1 week, see your doctor. °Fever and sore throat can indicate strep. °Fever and cough can indicate influenza or pneumonia. °Any kind of severe symptom such as difficulty breathing, intractable vomiting, or severe pain should prompt you to see  a doctor as soon as possible. ° ° °Your body's immune system is really the thing that will get rid of this infection.  Your immune system is comprised of 2 types of specialized cells called T cells and B cells.  T cells coordinate the array of cells in your body that engulf invading bacteria or viruses while B cells orchestrate the production of antibodies that neutralize infection.  Anything we do or any medications we give you, will just strengthen your immune system or help it clear up the infection quicker.  Here are a few helpful hints to improve your immune system to help overcome this illness or to prevent future infections: °· A few vitamins can improve the health of your immune system.  That's why your diet should include plenty of fruits, vegetables, fish, nuts, and whole grains. °· Vitamin A and bet-carotene can increase the cells that fight infections (T cells and B cells).  Vitamin A is abundant in dark greens and orange vegetables such as spinach, greens, sweet potatoes, and carrots. °· Vitamin B6 contributes to the maturation of white blood cells, the cells that fight disease.  Foods with vitamin B6 include cold cereal and bananas. °· Vitamin C is credited with preventing colds because it increases white blood cells and also prevents cellular damage.  Citrus fruits, peaches and green and red bell peppers are all hight in vitamin C. °· Vitamin E is an anti-oxidant that encourages the production of natural killer cells which reject foreign invaders and B cells that produce antibodies.  Foods high in vitamin E include wheat germ, nuts and seeds. °· Foods high in omega-3 fatty acids found in foods like salmon, tuna and mackerel boost your immune system and help cells to engulf and absorb germs. °· Probiotics are good bacteria that increase your T cells.  These can be found in yogurt and are available in supplements such as Culturelle or Align. °· Moderate exercise increases the strength of your immune  system and your ability to recover from illness.  I suggest 3 to 5 moderate intensity 30 minute workouts per week.   °· Sleep is another component of maintaining a strong immune system.  It enables your body to recuperate from the day's activities, stress and work.  My recommendation is to get between 7 and 9 hours of sleep per night. °· If you smoke, try to quit completely or at least cut down.  Drink alcohol only in moderation if at all.  No more than 2 drinks daily for men or 1 for women. °· Get a flu vaccine early in the fall or if you have not gotten one yet, once this illness has run its course.  If you are over 65, a smoker, or an asthmatic, get a pneumococcal vaccine. °· My final recommendation is to maintain a healthy weight.  Excess weight can impair the immune system by interfering with the way the immune system deals with invading viruses or   bacteria. ° ° ° °Bronchitis °Bronchitis is inflammation of the airways that extend from the windpipe into the lungs (bronchi). The inflammation often causes mucus to develop, which leads to a cough. If the inflammation becomes severe, it may cause shortness of breath. °CAUSES  °Bronchitis may be caused by:  °· Viral infections.   °· Bacteria.   °· Cigarette smoke.   °· Allergens, pollutants, and other irritants.   °SIGNS AND SYMPTOMS  °The most common symptom of bronchitis is a frequent cough that produces mucus. Other symptoms include: °· Fever.   °· Body aches.   °· Chest congestion.   °· Chills.   °· Shortness of breath.   °· Sore throat.   °DIAGNOSIS  °Bronchitis is usually diagnosed through a medical history and physical exam. Tests, such as chest X-rays, are sometimes done to rule out other conditions.  °TREATMENT  °You may need to avoid contact with whatever caused the problem (smoking, for example). Medicines are sometimes needed. These may include: °· Antibiotics. These may be prescribed if the condition is caused by bacteria. °· Cough suppressants. These  may be prescribed for relief of cough symptoms.   °· Inhaled medicines. These may be prescribed to help open your airways and make it easier for you to breathe.   °· Steroid medicines. These may be prescribed for those with recurrent (chronic) bronchitis. °HOME CARE INSTRUCTIONS °· Get plenty of rest.   °· Drink enough fluids to keep your urine clear or pale yellow (unless you have a medical condition that requires fluid restriction). Increasing fluids may help thin your secretions and will prevent dehydration.   °· Only take over-the-counter or prescription medicines as directed by your health care provider. °· Only take antibiotics as directed. Make sure you finish them even if you start to feel better. °· Avoid secondhand smoke, irritating chemicals, and strong fumes. These will make bronchitis worse. If you are a smoker, quit smoking. Consider using nicotine gum or skin patches to help control withdrawal symptoms. Quitting smoking will help your lungs heal faster.   °· Put a cool-mist humidifier in your bedroom at night to moisten the air. This may help loosen mucus. Change the water in the humidifier daily. You can also run the hot water in your shower and sit in the bathroom with the door closed for 5 10 minutes.   °· Follow up with your health care provider as directed.   °· Wash your hands frequently to avoid catching bronchitis again or spreading an infection to others.   °SEEK MEDICAL CARE IF: °Your symptoms do not improve after 1 week of treatment.  °SEEK IMMEDIATE MEDICAL CARE IF: °· Your fever increases. °· You have chills.   °· You have chest pain.   °· You have worsening shortness of breath.   °· You have bloody sputum. °· You faint.   °· You have lightheadedness. °· You have a severe headache.   °· You vomit repeatedly. °MAKE SURE YOU:  °· Understand these instructions. °· Will watch your condition. °· Will get help right away if you are not doing well or get worse. °Document Released: 07/30/2005  Document Revised: 05/20/2013 Document Reviewed: 03/24/2013 °ExitCare® Patient Information ©2014 [employer]. ° °Sinusitis °Sinusitis is redness, soreness, and swelling (inflammation) of the paranasal sinuses. Paranasal sinuses are air pockets within the bones of your face (beneath the eyes, the middle of the forehead, or above the eyes). In healthy paranasal sinuses, mucus is able to drain out, and air is able to circulate through them by way of your nose. However, when your paranasal sinuses are inflamed, mucus and air can become trapped.   This can allow bacteria and other germs to grow and cause infection. °Sinusitis can develop quickly and last only a short time (acute) or continue over a long period (chronic). Sinusitis that lasts for more than 12 weeks is considered chronic.  °CAUSES  °Causes of sinusitis include: °· Allergies. °· Structural abnormalities, such as displacement of the cartilage that separates your nostrils (deviated septum), which can decrease the air flow through your nose and sinuses and affect sinus drainage. °· Functional abnormalities, such as when the small hairs (cilia) that line your sinuses and help remove mucus do not work properly or are not present. °SYMPTOMS  °Symptoms of acute and chronic sinusitis are the same. The primary symptoms are pain and pressure around the affected sinuses. Other symptoms include: °· Upper toothache. °· Earache. °· Headache. °· Bad breath. °· Decreased sense of smell and taste. °· A cough, which worsens when you are lying flat. °· Fatigue. °· Fever. °· Thick drainage from your nose, which often is green and may contain pus (purulent). °· Swelling and warmth over the affected sinuses. °DIAGNOSIS  °Your caregiver will perform a physical exam. During the exam, your caregiver may: °· Look in your nose for signs of abnormal growths in your nostrils (nasal polyps). °· Tap over the affected sinus to check for signs of infection. °· View the inside of your  sinuses (endoscopy) with a special imaging device with a light attached (endoscope), which is inserted into your sinuses. °If your caregiver suspects that you have chronic sinusitis, one or more of the following tests may be recommended: °· Allergy tests. °· Nasal culture A sample of mucus is taken from your nose and sent to a lab and screened for bacteria. °· Nasal cytology A sample of mucus is taken from your nose and examined by your caregiver to determine if your sinusitis is related to an allergy. °TREATMENT  °Most cases of acute sinusitis are related to a viral infection and will resolve on their own within 10 days. Sometimes medicines are prescribed to help relieve symptoms (pain medicine, decongestants, nasal steroid sprays, or saline sprays).  °However, for sinusitis related to a bacterial infection, your caregiver will prescribe antibiotic medicines. These are medicines that will help kill the bacteria causing the infection.  °Rarely, sinusitis is caused by a fungal infection. In theses cases, your caregiver will prescribe antifungal medicine. °For some cases of chronic sinusitis, surgery is needed. Generally, these are cases in which sinusitis recurs more than 3 times per year, despite other treatments. °HOME CARE INSTRUCTIONS  °· Drink plenty of water. Water helps thin the mucus so your sinuses can drain more easily. °· Use a humidifier. °· Inhale steam 3 to 4 times a day (for example, sit in the bathroom with the shower running). °· Apply a warm, moist washcloth to your face 3 to 4 times a day, or as directed by your caregiver. °· Use saline nasal sprays to help moisten and clean your sinuses. °· Take over-the-counter or prescription medicines for pain, discomfort, or fever only as directed by your caregiver. °SEEK IMMEDIATE MEDICAL CARE IF: °· You have increasing pain or severe headaches. °· You have nausea, vomiting, or drowsiness. °· You have swelling around your face. °· You have vision  problems. °· You have a stiff neck. °· You have difficulty breathing. °MAKE SURE YOU:  °· Understand these instructions. °· Will watch your condition. °· Will get help right away if you are not doing well or get worse. °Document Released:   07/30/2005 Document Revised: 10/22/2011 Document Reviewed: 08/14/2011 °ExitCare® Patient Information ©2014 [employer]. ° °

## 2013-12-16 NOTE — ED Notes (Signed)
C/o  Productive cough with green sputum.  Chest congestion.  Itchy throat.  Sneezing.  Nasal stuffiness.  X 2 wks.  No relief with otc meds.

## 2014-04-07 ENCOUNTER — Other Ambulatory Visit (HOSPITAL_COMMUNITY): Payer: Self-pay | Admitting: Specialist

## 2014-04-07 DIAGNOSIS — E039 Hypothyroidism, unspecified: Secondary | ICD-10-CM

## 2014-05-10 ENCOUNTER — Encounter (HOSPITAL_COMMUNITY)
Admission: RE | Admit: 2014-05-10 | Discharge: 2014-05-10 | Disposition: A | Discharge status: Home / Self Care | Payer: 59 | Source: Ambulatory Visit | Attending: Specialist | Admitting: Specialist

## 2014-05-10 DIAGNOSIS — E039 Hypothyroidism, unspecified: Secondary | ICD-10-CM | POA: Insufficient documentation

## 2014-05-10 MED ORDER — SODIUM IODIDE I 131 CAPSULE
21.1000 | Freq: Once | INTRAVENOUS | Status: AC | PRN
  Administered 2014-05-10: 21.1 via ORAL

## 2014-05-11 ENCOUNTER — Encounter (HOSPITAL_COMMUNITY)
Admission: RE | Admit: 2014-05-11 | Discharge: 2014-05-11 | Disposition: A | Discharge status: Home / Self Care | Payer: 59 | Source: Ambulatory Visit | Attending: Specialist | Admitting: Specialist

## 2014-05-11 MED ORDER — SODIUM PERTECHNETATE TC 99M INJECTION
10.0000 | Freq: Once | INTRAVENOUS | Status: AC | PRN
  Administered 2014-05-11: 10 via INTRAVENOUS

## 2014-06-14 ENCOUNTER — Encounter (HOSPITAL_COMMUNITY): Payer: Self-pay | Admitting: Emergency Medicine

## 2017-02-14 DIAGNOSIS — H02401 Unspecified ptosis of right eyelid: Secondary | ICD-10-CM | POA: Diagnosis not present

## 2017-02-14 DIAGNOSIS — H5702 Anisocoria: Secondary | ICD-10-CM | POA: Diagnosis not present

## 2017-02-14 DIAGNOSIS — H5213 Myopia, bilateral: Secondary | ICD-10-CM | POA: Diagnosis not present

## 2017-02-15 ENCOUNTER — Emergency Department (HOSPITAL_COMMUNITY): Payer: 59

## 2017-02-15 ENCOUNTER — Other Ambulatory Visit: Payer: Self-pay

## 2017-02-15 ENCOUNTER — Encounter (HOSPITAL_COMMUNITY): Payer: Self-pay

## 2017-02-15 ENCOUNTER — Ambulatory Visit (HOSPITAL_COMMUNITY)
Admission: EM | Admit: 2017-02-15 | Discharge: 2017-02-15 | Disposition: A | Discharge status: Home / Self Care | Payer: 59 | Attending: Internal Medicine | Admitting: Internal Medicine

## 2017-02-15 ENCOUNTER — Emergency Department (HOSPITAL_COMMUNITY)
Admission: EM | Admit: 2017-02-15 | Discharge: 2017-02-15 | Disposition: A | Discharge status: Home / Self Care | Payer: 59 | Attending: Emergency Medicine | Admitting: Emergency Medicine

## 2017-02-15 DIAGNOSIS — G510 Bell's palsy: Secondary | ICD-10-CM | POA: Insufficient documentation

## 2017-02-15 DIAGNOSIS — R51 Headache: Secondary | ICD-10-CM | POA: Diagnosis not present

## 2017-02-15 DIAGNOSIS — H02401 Unspecified ptosis of right eyelid: Secondary | ICD-10-CM

## 2017-02-15 DIAGNOSIS — I10 Essential (primary) hypertension: Secondary | ICD-10-CM | POA: Diagnosis not present

## 2017-02-15 DIAGNOSIS — R030 Elevated blood-pressure reading, without diagnosis of hypertension: Secondary | ICD-10-CM

## 2017-02-15 DIAGNOSIS — R079 Chest pain, unspecified: Secondary | ICD-10-CM | POA: Diagnosis not present

## 2017-02-15 DIAGNOSIS — R0789 Other chest pain: Secondary | ICD-10-CM | POA: Diagnosis not present

## 2017-02-15 DIAGNOSIS — Z87891 Personal history of nicotine dependence: Secondary | ICD-10-CM | POA: Diagnosis not present

## 2017-02-15 LAB — CBC
HCT: 41.7 % (ref 36.0–46.0)
Hemoglobin: 13.6 g/dL (ref 12.0–15.0)
MCH: 29.1 pg (ref 26.0–34.0)
MCHC: 32.6 g/dL (ref 30.0–36.0)
MCV: 89.3 fL (ref 78.0–100.0)
PLATELETS: 326 10*3/uL (ref 150–400)
RBC: 4.67 MIL/uL (ref 3.87–5.11)
RDW: 12.8 % (ref 11.5–15.5)
WBC: 9.7 10*3/uL (ref 4.0–10.5)

## 2017-02-15 LAB — BASIC METABOLIC PANEL
ANION GAP: 8 (ref 5–15)
BUN: 5 mg/dL — AB (ref 6–20)
CALCIUM: 9.4 mg/dL (ref 8.9–10.3)
CO2: 24 mmol/L (ref 22–32)
CREATININE: 0.62 mg/dL (ref 0.44–1.00)
Chloride: 106 mmol/L (ref 101–111)
GFR calc Af Amer: >60 mL/min (ref >60)
Glucose, Bld: 94 mg/dL (ref 65–99)
Potassium: 3.3 mmol/L — ABNORMAL LOW (ref 3.5–5.1)
Sodium: 138 mmol/L (ref 135–145)

## 2017-02-15 LAB — I-STAT TROPONIN, ED: TROPONIN I, POC: 0 ng/mL (ref 0.00–0.08)

## 2017-02-15 MED ORDER — PREDNISONE 10 MG PO TABS: 10.0000 mg | ORAL_TABLET | Freq: Every day | ORAL | 0 refills | Status: DC

## 2017-02-15 MED ORDER — IBUPROFEN 400 MG PO TABS
600.0000 mg | ORAL_TABLET | Freq: Once | ORAL | Status: AC
  Administered 2017-02-15: 600 mg via ORAL
  Filled 2017-02-15: qty 1

## 2017-02-15 MED ORDER — PREDNISONE 20 MG PO TABS: 60.0000 mg | ORAL_TABLET | Freq: Every day | ORAL | 0 refills | Status: DC

## 2017-02-15 NOTE — ED Provider Notes (Signed)
CSN: [id number]     Arrival date & time 02/15/17  1325 History   First MD Initiated Contact with Patient 02/15/17 1435     Chief Complaint  Patient presents with  . Hypertension   (Consider location/radiation/quality/duration/timing/severity/associated sxs/prior Treatment) HPI Jaclyn Wright is a 46 y.o. female presenting to UC with c/o Right eye droop since Tuesday July 3rd.  Pt notes she woke up with it drooping.  She went to an eye doctor who initially thought it was just muscles in her eyelid but referred pt to neurologist for 2nd opinion.  Neurologist had mentioned she could be developing an aneurysm but did "x-rays" and a "thorough exam."  Pt states her Right pupil was dilated yesterday but neurologist put drops in her eye to fix that.  She was advised it was safe for her to go home but was given a list of symptoms that warrant further evaluation at emergency department.  There was no mention of potential Bell's Palsy yesterday per pt. Pt denies change in symptoms today but is concerned her BP keeps getting elevated and she has some pressure in the Right side of her head.  Denies weakness or numbness besides in her Right upper eyelid.  Denies nausea or vomiting. No prior hx of HTN until this week. She is not on BP medication. No family hx of HTN or aneurysms but her step father had an aneurysm. Pt is concerned.    Past Medical History:  Diagnosis Date  . No pertinent past medical history    Past Surgical History:  Procedure Laterality Date  . CESAREAN SECTION    . TUBAL LIGATION    . tubal reversal     Family History  Problem Relation Age of Onset  . Anesthesia problems Neg Hx    Social History  Substance Use Topics  . Smoking status: Former Games developer  . Smokeless tobacco: Never Used     Comment: quit with pregnancy  . Alcohol use No   OB History    Gravida Para Term Preterm AB Living   5 4 4  0 1 4   SAB TAB Ectopic Multiple Live Births   0 1 0 0       Review of Systems    Constitutional: Negative for chills and fever.  Eyes: Positive for pain (Pressure on Right side of head) and visual disturbance (due to drooping of Right eyelid). Negative for photophobia, discharge and redness.  Gastrointestinal: Negative for nausea and vomiting.  Musculoskeletal: Negative for neck pain and neck stiffness.  Neurological: Positive for facial asymmetry, weakness (Right upper eyelid) and headaches. Negative for dizziness, syncope, speech difficulty, light-headedness and numbness.    Allergies  Patient has no known allergies.  Home Medications   Prior to Admission medications   Medication Sig Start Date End Date Taking? Authorizing Provider  amoxicillin (AMOXIL) 500 MG capsule Take 2 capsules (1,000 mg total) by mouth 3 (three) times daily. 12/16/13   Reuben Likes, MD  Aspirin-Salicylamide-Caffeine (BC HEADACHE POWDER PO) Take 1 packet by mouth 3 (three) times daily as needed. For headache or pain    [provider]  guaiFENesin-codeine (GUIATUSS AC) 100-10 MG/5ML syrup Take 10 mLs by mouth 4 (four) times daily as needed for cough. 12/16/13   Reuben Likes, MD  ibuprofen (ADVIL,MOTRIN) 200 MG tablet Take 200 mg by mouth every 6 (six) hours as needed. pain    [provider]  ipratropium (ATROVENT) 0.06 % nasal spray Place 2 sprays into both  nostrils 4 (four) times daily. 12/16/13   Reuben LikesKeller, David C, MD  Olopatadine HCl (PATADAY) 0.2 % SOLN 1 drop in each eye daily 12/16/13   Reuben LikesKeller, David C, MD  predniSONE (DELTASONE) 20 MG tablet Take 1 tablet (20 mg total) by mouth 2 (two) times daily. 12/16/13   Reuben LikesKeller, David C, MD   Meds Ordered and Administered this Visit  Medications - No data to display  BP (!) 151/88 (BP Location: Left Arm)   Pulse 82   Temp 98.5 F (36.9 C) (Oral)   Resp 20   SpO2 99%  No data found.   Physical Exam  Constitutional: She is oriented to person, place, and time. She appears well-developed and well-nourished. No distress.  HENT:   Head: Normocephalic and atraumatic.    Right Ear: Tympanic membrane normal.  Left Ear: Tympanic membrane normal.  Nose: Nose normal.  Mouth/Throat: Uvula is midline, oropharynx is clear and moist and mucous membranes are normal.  Eyes: Conjunctivae and EOM are normal. Pupils are equal, round, and reactive to light. Right eye exhibits no discharge. Left eye exhibits no discharge. No scleral icterus.  Drooping Right upper eyelid  Neck: Normal range of motion.  Cardiovascular: Normal rate.   Pulmonary/Chest: Effort normal.  Musculoskeletal: Normal range of motion.  Neurological: She is alert and oriented to person, place, and time.  Right eyelid droop, unable to move Right eyebrow in sync with Left eyebrow.   Smile is symmetric. Able to stick tongue out straight. Alert to person, place, and time. Speech is clear. Normal gait.   Skin: Skin is warm and dry. She is not diaphoretic.  Psychiatric: She has a normal mood and affect. Her behavior is normal.  Nursing note and vitals reviewed.   Urgent Care Course     Procedures (including critical care time)  Labs Review Labs Reviewed - No data to display  Imaging Review No results found.   MDM   1. Drooping eyelid, right   2. Elevated blood pressure reading    Pt presenting with Right upper eyelid droop Exam most c/w Bell's Palsy, however, with pt reporting elevated BP the last few days along with Right sided head pressure and reports neurologist was somewhat concerned for aneurysm, will send pt to ED for further evaluation.  Pt is stable. Eye droop started 4 days ago. No code stroke indicated at this time.    Lurene Shadowhelps, Colletta Spillers O, [place] 02/15/17 1459

## 2017-02-15 NOTE — ED Triage Notes (Addendum)
Per Pt, Pt is coming from UC with complaints of right head pressure and right high pain that started two days ago. Pt reports some intermittent dizziness, but denies chest pain, SOB, N/V. Pt reports being sent here to R/O and aneurysm. When questioned, pt reports intermittent chest pain since yesterday.

## 2017-02-15 NOTE — ED Notes (Signed)
Pt saw Neurologist yesterday for the same symptoms, and she was fully assessed and cleared, but was told to return if symptoms worsened. Pt denies any worsening symptoms, but wanted to be further evaluated.

## 2017-02-15 NOTE — ED Provider Notes (Signed)
I saw and evaluated the patient, reviewed the resident's note and I agree with the findings and plan.   EKG Interpretation None        Results for orders placed or performed during the hospital encounter of 02/15/17  Basic metabolic panel  Result Value Ref Range   Sodium 138 135 - 145 mmol/L   Potassium 3.3 (L) 3.5 - 5.1 mmol/L   Chloride 106 101 - 111 mmol/L   CO2 24 22 - 32 mmol/L   Glucose, Bld 94 65 - 99 mg/dL   BUN 5 (L) 6 - 20 mg/dL   Creatinine, Ser 1.61 0.44 - 1.00 mg/dL   Calcium 9.4 8.9 - 09.6 mg/dL   GFR calc non Af Amer >60 >60 mL/min   GFR calc Af Amer >60 >60 mL/min   Anion gap 8 5 - 15  CBC  Result Value Ref Range   WBC 9.7 4.0 - 10.5 K/uL   RBC 4.67 3.87 - 5.11 MIL/uL   Hemoglobin 13.6 12.0 - 15.0 g/dL   HCT 04.5 40.9 - 81.1 %   MCV 89.3 78.0 - 100.0 fL   MCH 29.1 26.0 - 34.0 pg   MCHC 32.6 30.0 - 36.0 g/dL   RDW 91.4 78.2 - 95.6 %   Platelets 326 150 - 400 K/uL  I-stat troponin, ED  Result Value Ref Range   Troponin i, poc 0.00 0.00 - 0.08 ng/mL   Comment 3           Dg Chest 2 View  Result Date: 02/15/2017 CLINICAL DATA:  Midsternal chest pain x2 days EXAM: CHEST  2 VIEW COMPARISON:  05/11/2010 FINDINGS: The heart size and mediastinal contours are within normal limits. Both lungs are clear. The visualized skeletal structures are unremarkable. IMPRESSION: No active cardiopulmonary disease. Electronically Signed   By: Tollie Eth M.D.   On: 02/15/2017 17:15   Mr Brain Wo Contrast  Result Date: 02/15/2017 CLINICAL DATA:  RIGHT head pressure, hypertensive. RIGHT eyelid droop. EXAM: MRI HEAD WITHOUT CONTRAST TECHNIQUE: Multiplanar, multiecho pulse sequences of the brain and surrounding structures were obtained without intravenous contrast. COMPARISON:  None. FINDINGS: BRAIN: No reduced diffusion to suggest acute ischemia. No susceptibility artifact to suggest hemorrhage. The ventricles and sulci are normal for patient's age. No suspicious parenchymal signal,  mass or mass effect. No abnormal extra-axial fluid collections. VASCULAR: Normal major intracranial vascular flow voids present at skull base. SKULL AND UPPER CERVICAL SPINE: No abnormal sellar expansion. No suspicious calvarial bone marrow signal. Craniocervical junction maintained. SINUSES/ORBITS: The mastoid air-cells and included paranasal sinuses are well-aerated. The included ocular globes and orbital contents are non-suspicious. OTHER: None. IMPRESSION: Normal noncontrast MRI head. Electronically Signed   By: Awilda Metro M.D.   On: 02/15/2017 22:21    Patient seen by me along with the resident.  Patient has been seen by ophthalmology as well as urging care patient has some funny sensations to the right side of her face and some drooping of her left eyelid. Also talks about some intermittent dizziness. No chest pain no shortness breath no nausea vomiting. Patient was told symptoms could be consistent with Bell's palsy from the urgent care. Tender agree with that based on the presentation.  MRI of brain without any acute findings. This does not seem to be consistent with an aneurysm. Patient without significant pain. Patient has not seen her neurologist yet. She has seen an ophthalmologist. A pathologist found no problems with the eye itself. Will treat as if it's  Bell's palsy and have arranged follow-up with neurology.  Patients able to close her eye. Patient has bowel partial movement of the right side of her face. So this would be consistent with a partial Bell's palsy.   Vanetta MuldersZackowski, Shafiq Larch, MD 02/15/17 2245

## 2017-02-15 NOTE — ED Provider Notes (Signed)
[facility]-EMERGENCY DEPT Provider Note   CSN: [id number] Arrival date & time: 02/15/17  1530     History   Chief Complaint Chief Complaint  Patient presents with  . Headache  . Chest Pain    HPI Jaclyn Wright is a 46 y.o. female.  The history is provided by the patient and the spouse.  Illness  This is a new problem. The current episode started more than 2 days ago. The problem occurs constantly. The problem has not changed since onset.Associated symptoms include headaches. Pertinent negatives include no chest pain, no abdominal pain and no shortness of breath. Associated symptoms comments: Right eye droop, mild headache. Nothing aggravates the symptoms. Nothing relieves the symptoms. She has tried nothing for the symptoms. The treatment provided no relief.    Past Medical History:  Diagnosis Date  . No pertinent past medical history     There are no active problems to display for this patient.   Past Surgical History:  Procedure Laterality Date  . CESAREAN SECTION    . TUBAL LIGATION    . tubal reversal      OB History    Gravida Para Term Preterm AB Living   5 4 4  0 1 4   SAB TAB Ectopic Multiple Live Births   0 1 0 0         Home Medications    Prior to Admission medications   Medication Sig Start Date End Date Taking? Authorizing Provider  Aspirin-Salicylamide-Caffeine (BC HEADACHE POWDER PO) Take 1 packet by mouth 3 (three) times daily as needed. For headache or pain   Yes [provider]  ibuprofen (ADVIL,MOTRIN) 200 MG tablet Take 200-400 mg by mouth every 6 (six) hours as needed for moderate pain.    Yes [provider]  predniSONE (DELTASONE) 10 MG tablet Take 1 tablet (10 mg total) by mouth daily. 02/15/17   Orson Slick, MD  predniSONE (DELTASONE) 20 MG tablet Take 3 tablets (60 mg total) by mouth daily. 02/15/17   Orson Slick, MD    Family History Family History  Problem Relation Age of Onset  . Anesthesia problems Neg Hx      Social History Social History  Substance Use Topics  . Smoking status: Former Games developer  . Smokeless tobacco: Never Used     Comment: quit with pregnancy  . Alcohol use No     Allergies   Patient has no known allergies.   Review of Systems Review of Systems  Constitutional: Negative for chills and fever.  HENT: Negative for ear pain, facial swelling, hearing loss, postnasal drip, rhinorrhea, sinus pain and sore throat.        Right eyelid droop  Eyes: Negative for photophobia, pain, discharge, redness and itching.  Respiratory: Negative for cough and shortness of breath.   Cardiovascular: Negative for chest pain and palpitations.  Gastrointestinal: Negative for abdominal pain and vomiting.  Genitourinary: Negative for dysuria and hematuria.  Musculoskeletal: Negative for arthralgias and back pain.  Skin: Negative for color change and rash.  Allergic/Immunologic: Negative for immunocompromised state.  Neurological: Positive for headaches. Negative for seizures and syncope.  Psychiatric/Behavioral: Negative for agitation and behavioral problems.  All other systems reviewed and are negative.    Physical Exam Updated Vital Signs BP 126/83   Pulse 84   Temp 98.7 F (37.1 C) (Oral)   Resp 12   Ht 5\' 8"  (1.727 m)   Wt 116.1 kg (256 lb)   SpO2 98%   BMI  38.92 kg/m   Physical Exam  Constitutional: She is oriented to person, place, and time. She appears well-developed and well-nourished. No distress.  HENT:  Head: Normocephalic and atraumatic.  Right Ear: External ear normal.  Left Ear: External ear normal.  Nose: Nose normal.  Mouth/Throat: Oropharynx is clear and moist. No oropharyngeal exudate.  Eyes: Conjunctivae and EOM are normal. Pupils are equal, round, and reactive to light. Right eye exhibits no discharge. Left eye exhibits no discharge. No scleral icterus.  Patient with right upper eyelid droop, equal and equal and reactive bilaterally.  Neck: Neck supple.  No tracheal deviation present. No thyromegaly present.  Cardiovascular: Normal rate and regular rhythm.  Exam reveals no friction rub.   No murmur heard. Pulmonary/Chest: Effort normal and breath sounds normal. No respiratory distress.  Abdominal: Soft. Bowel sounds are normal. She exhibits no distension. There is no tenderness. There is no rebound.  Musculoskeletal: She exhibits no edema, tenderness or deformity.  Neurological: She is alert and oriented to person, place, and time. No cranial nerve deficit. Coordination normal.  Skin: Skin is warm and dry. She is not diaphoretic.  Psychiatric: She has a normal mood and affect.  Nursing note and vitals reviewed.    ED Treatments / Results  Labs (all labs ordered are listed, but only abnormal results are displayed) Labs Reviewed  BASIC METABOLIC PANEL - Abnormal; Notable for the following:       Result Value   Potassium 3.3 (*)    BUN 5 (*)    All other components within normal limits  CBC  I-STAT TROPOININ, ED    EKG  EKG Interpretation None       Radiology Dg Chest 2 View  Result Date: 02/15/2017 CLINICAL DATA:  Midsternal chest pain x2 days EXAM: CHEST  2 VIEW COMPARISON:  05/11/2010 FINDINGS: The heart size and mediastinal contours are within normal limits. Both lungs are clear. The visualized skeletal structures are unremarkable. IMPRESSION: No active cardiopulmonary disease. Electronically Signed   By: Tollie Ethavid  Kwon M.D.   On: 02/15/2017 17:15   Mr Brain Wo Contrast  Result Date: 02/15/2017 CLINICAL DATA:  RIGHT head pressure, hypertensive. RIGHT eyelid droop. EXAM: MRI HEAD WITHOUT CONTRAST TECHNIQUE: Multiplanar, multiecho pulse sequences of the brain and surrounding structures were obtained without intravenous contrast. COMPARISON:  None. FINDINGS: BRAIN: No reduced diffusion to suggest acute ischemia. No susceptibility artifact to suggest hemorrhage. The ventricles and sulci are normal for patient's age. No suspicious  parenchymal signal, mass or mass effect. No abnormal extra-axial fluid collections. VASCULAR: Normal major intracranial vascular flow voids present at skull base. SKULL AND UPPER CERVICAL SPINE: No abnormal sellar expansion. No suspicious calvarial bone marrow signal. Craniocervical junction maintained. SINUSES/ORBITS: The mastoid air-cells and included paranasal sinuses are well-aerated. The included ocular globes and orbital contents are non-suspicious. OTHER: None. IMPRESSION: Normal noncontrast MRI head. Electronically Signed   By: Awilda Metroourtnay  Bloomer M.D.   On: 02/15/2017 22:21    Procedures Procedures (including critical care time)  Medications Ordered in ED Medications  ibuprofen (ADVIL,MOTRIN) tablet 600 mg (600 mg Oral Given 02/15/17 2041)     Initial Impression / Assessment and Plan / ED Course  I have reviewed the triage vital signs and the nursing notes.  Pertinent labs & imaging results that were available during my care of the patient were reviewed by me and considered in my medical decision making (see chart for details).     Jaclyn Wright is a 46 year old female with no  significant history, the day with headache and right eye problem. Patient states since Tuesday morning she noticed her right eyelid was drooping and she had a large pupil on the right side. She has had an associated headache that has been gradually worsening since that time. It is not the worst headache of her life and she has had no nausea, vomiting, altered mental status. Denies any recent infections. Describes headache as pressure on the right side. Patient initially seen by urgent care and sent here for further evaluation. Patient did state she also saw an ophthalmologist yesterday for similar symptoms and was given an eyedrop that she stated made her people go back to the normal size.  On exam she is sitting up in bed and still reports a headache. Pupils are equal and reactive bilaterally. There is right sided  upper eyelid droop. EKG obtained shows atrial bigeminy. Patient currently does not report chest pain, shortness of breath or any other symptoms concerning for ACS or other cardiac abnormality. MRI brain ordered and was negative for any abnormality. Doubt stroke/TIA, aneurysm or other acute cranial abnormality causing her symptoms. Likely Bell's palsy. Patient will be discharged with instruction to follow-up with her primary care provider as well as given the number to contact neurology for outpatient follow-up. She voiced understanding and agreement to the plan and threw shared decision making she was comfortable going home. Will be given ten-day course of tapering prednisone. Discharged in stable condition.  Patient was seen with my attending, Dr. Deretha Emory, who voiced agreement and oversaw the evaluation and treatment of this patient.   Dragon Medical illustrator was used in the creation of this note. If there are any errors or inconsistencies needing clarification, please contact me directly.   Final Clinical Impressions(s) / ED Diagnoses   Final diagnoses:  Bell's palsy    New Prescriptions New Prescriptions   PREDNISONE (DELTASONE) 10 MG TABLET    Take 1 tablet (10 mg total) by mouth daily.   PREDNISONE (DELTASONE) 20 MG TABLET    Take 3 tablets (60 mg total) by mouth daily.     Orson Slick, MD 02/15/17 2251    Vanetta Mulders, MD 02/18/17 [phone]

## 2017-02-15 NOTE — ED Notes (Signed)
Pt verbalized understanding discharge instructions and denies any further needs or questions at this time. VS stable, ambulatory and steady gait.   

## 2017-02-15 NOTE — ED Triage Notes (Signed)
Pt said she is here for high bp. Said it's been going on for a while. Said she also woke up 2 days ago and her right eye is now opening all the way and thought it was because of her bp.Also having pressure the right side of her head. Did see her neurologist yesterday and said everything looked fine on their end. York SpanielSaid it was due to the muscle in her eye.

## 2017-06-17 DIAGNOSIS — M79604 Pain in right leg: Secondary | ICD-10-CM | POA: Diagnosis not present

## 2017-06-17 DIAGNOSIS — R030 Elevated blood-pressure reading, without diagnosis of hypertension: Secondary | ICD-10-CM | POA: Diagnosis not present

## 2017-06-17 DIAGNOSIS — Z7689 Persons encountering health services in other specified circumstances: Secondary | ICD-10-CM | POA: Diagnosis not present

## 2017-06-17 DIAGNOSIS — Z1231 Encounter for screening mammogram for malignant neoplasm of breast: Secondary | ICD-10-CM | POA: Diagnosis not present

## 2017-06-17 DIAGNOSIS — N926 Irregular menstruation, unspecified: Secondary | ICD-10-CM | POA: Diagnosis not present

## 2017-06-17 DIAGNOSIS — Z23 Encounter for immunization: Secondary | ICD-10-CM | POA: Diagnosis not present

## 2017-07-17 DIAGNOSIS — Z1231 Encounter for screening mammogram for malignant neoplasm of breast: Secondary | ICD-10-CM | POA: Diagnosis not present

## 2017-07-30 DIAGNOSIS — Z124 Encounter for screening for malignant neoplasm of cervix: Secondary | ICD-10-CM | POA: Diagnosis not present

## 2017-07-30 DIAGNOSIS — M79604 Pain in right leg: Secondary | ICD-10-CM | POA: Diagnosis not present

## 2017-07-30 DIAGNOSIS — Z0000 Encounter for general adult medical examination without abnormal findings: Secondary | ICD-10-CM | POA: Diagnosis not present

## 2017-09-04 DIAGNOSIS — Z3169 Encounter for other general counseling and advice on procreation: Secondary | ICD-10-CM | POA: Diagnosis not present

## 2017-09-04 DIAGNOSIS — Z9889 Other specified postprocedural states: Secondary | ICD-10-CM | POA: Diagnosis not present

## 2017-09-04 DIAGNOSIS — E669 Obesity, unspecified: Secondary | ICD-10-CM | POA: Diagnosis not present

## 2017-09-20 DIAGNOSIS — B373 Candidiasis of vulva and vagina: Secondary | ICD-10-CM | POA: Diagnosis not present

## 2017-09-20 DIAGNOSIS — M25561 Pain in right knee: Secondary | ICD-10-CM | POA: Diagnosis not present

## 2017-09-20 DIAGNOSIS — N898 Other specified noninflammatory disorders of vagina: Secondary | ICD-10-CM | POA: Diagnosis not present

## 2017-09-20 DIAGNOSIS — B9689 Other specified bacterial agents as the cause of diseases classified elsewhere: Secondary | ICD-10-CM | POA: Diagnosis not present

## 2017-09-20 DIAGNOSIS — N760 Acute vaginitis: Secondary | ICD-10-CM | POA: Diagnosis not present

## 2017-09-20 DIAGNOSIS — G8929 Other chronic pain: Secondary | ICD-10-CM | POA: Diagnosis not present

## 2017-10-02 DIAGNOSIS — M94261 Chondromalacia, right knee: Secondary | ICD-10-CM | POA: Diagnosis not present

## 2017-10-02 DIAGNOSIS — R52 Pain, unspecified: Secondary | ICD-10-CM | POA: Diagnosis not present

## 2017-11-20 DIAGNOSIS — N858 Other specified noninflammatory disorders of uterus: Secondary | ICD-10-CM | POA: Diagnosis not present

## 2017-11-20 DIAGNOSIS — E2839 Other primary ovarian failure: Secondary | ICD-10-CM | POA: Diagnosis not present

## 2017-11-26 DIAGNOSIS — Z1159 Encounter for screening for other viral diseases: Secondary | ICD-10-CM | POA: Diagnosis not present

## 2017-11-26 DIAGNOSIS — Z113 Encounter for screening for infections with a predominantly sexual mode of transmission: Secondary | ICD-10-CM | POA: Diagnosis not present

## 2017-11-26 DIAGNOSIS — Z0183 Encounter for blood typing: Secondary | ICD-10-CM | POA: Diagnosis not present

## 2017-11-26 DIAGNOSIS — Z118 Encounter for screening for other infectious and parasitic diseases: Secondary | ICD-10-CM | POA: Diagnosis not present

## 2017-12-06 DIAGNOSIS — Z789 Other specified health status: Secondary | ICD-10-CM | POA: Diagnosis not present

## 2017-12-06 DIAGNOSIS — Z9889 Other specified postprocedural states: Secondary | ICD-10-CM | POA: Diagnosis not present

## 2017-12-06 DIAGNOSIS — Z3169 Encounter for other general counseling and advice on procreation: Secondary | ICD-10-CM | POA: Diagnosis not present

## 2018-01-29 MED FILL — ESTRING 2 MG VAGINAL RING: 2 | 90 days supply | Qty: 1 | Fill #0

## 2018-01-29 MED FILL — KELNOR 1-35 28 TABLET: 1-35 | 56 days supply | Qty: 56 | Fill #0

## 2018-02-12 DIAGNOSIS — M5441 Lumbago with sciatica, right side: Secondary | ICD-10-CM | POA: Diagnosis not present

## 2018-02-12 DIAGNOSIS — G8929 Other chronic pain: Secondary | ICD-10-CM | POA: Diagnosis not present

## 2018-02-12 DIAGNOSIS — R399 Unspecified symptoms and signs involving the genitourinary system: Secondary | ICD-10-CM | POA: Diagnosis not present

## 2018-02-12 DIAGNOSIS — K5900 Constipation, unspecified: Secondary | ICD-10-CM | POA: Diagnosis not present

## 2018-02-20 DIAGNOSIS — Z111 Encounter for screening for respiratory tuberculosis: Secondary | ICD-10-CM | POA: Diagnosis not present

## 2019-01-27 ENCOUNTER — Ambulatory Visit (INDEPENDENT_AMBULATORY_CARE_PROVIDER_SITE_OTHER): Payer: Medicaid Other | Admitting: Neurology

## 2019-01-27 ENCOUNTER — Other Ambulatory Visit: Payer: Self-pay

## 2019-01-27 ENCOUNTER — Encounter: Payer: Self-pay | Admitting: Neurology

## 2019-01-27 VITALS — BP 135/80 | HR 96 | Ht 64.0 in | Wt 270.0 lb

## 2019-01-27 DIAGNOSIS — R351 Nocturia: Secondary | ICD-10-CM

## 2019-01-27 DIAGNOSIS — G479 Sleep disorder, unspecified: Secondary | ICD-10-CM

## 2019-01-27 DIAGNOSIS — R51 Headache: Secondary | ICD-10-CM

## 2019-01-27 DIAGNOSIS — G4700 Insomnia, unspecified: Secondary | ICD-10-CM

## 2019-01-27 DIAGNOSIS — R0683 Snoring: Secondary | ICD-10-CM

## 2019-01-27 DIAGNOSIS — R519 Headache, unspecified: Secondary | ICD-10-CM

## 2019-01-27 DIAGNOSIS — R5383 Other fatigue: Secondary | ICD-10-CM

## 2019-01-27 DIAGNOSIS — Z6841 Body Mass Index (BMI) 40.0 and over, adult: Secondary | ICD-10-CM

## 2019-01-27 NOTE — Patient Instructions (Signed)

## 2019-01-27 NOTE — Progress Notes (Signed)
Subjective:    Patient ID: Jaclyn Wright is a 48 y.o. female.  HPI     Star Age, MD, PhD [facility] [address] [place],  [address] Dear Dr. Garwin Brothers,   I saw your patient, Jaclyn Wright, upon your kind request to my sleep clinic today for initial consultation of her sleep disorder, in particular, concern for underlying obstructive sleep apnea.  The patient is unaccompanied today.  As you know, Jaclyn Wright is a 48 year old right-handed woman with an underlying medical history of morbid obesity with a BMI of over 45, currently pregnant in the second trimester, who reports snoring and difficulty with sleep maintenance, daytime tiredness.    I reviewed your office note from 01/09/2019.  Her Epworth sleepiness score is 4 out of 24 today, fatigue severity score is 12 out of 63.  She denies a family history of sleep apnea.  She snores, this has been disturbing her husband.  Her pregnancy is going well, but since she became pregnant she has had difficulty staying asleep.  She tries to be in bed between 1030 and 11 and rise time is typically 740 7:55 AM.  She wakes up in the early morning hours typically around 2 or 3 AM and may not be back to sleep until 5, sometimes even 6 AM.  She works as a Quarry manager as a Neurosurgeon.  She has nocturia about once or twice per average night, she has woken up with a headache at times.  She has a TV in her bedroom but turns it off at night.  She lives with her husband, she has 4 children, all grown, youngest son is 67 and oldest daughter is 49, she has 2 daughters and 2 sons.  She does not currently drink any caffeine or alcohol.  She quit smoking some 7 years ago.  Her Past Medical History Is Significant For: Past Medical History:  Diagnosis Date  . No pertinent past medical history     Her Past Surgical History Is Significant For: Past Surgical History:  Procedure Laterality Date  . CESAREAN SECTION    . TUBAL  LIGATION    . tubal reversal      Her Family History Is Significant For: Family History  Problem Relation Age of Onset  . Anesthesia problems Neg Hx     Her Social History Is Significant For: Social History   Socioeconomic History  . Marital status: Married    Spouse name: Not on file  . Number of children: Not on file  . Years of education: Not on file  . Highest education level: Not on file  Occupational History  . Not on file  Social Needs  . Financial resource strain: Not on file  . Food insecurity    Worry: Not on file    Inability: Not on file  . Transportation needs    Medical: Not on file    Non-medical: Not on file  Tobacco Use  . Smoking status: Former Research scientist (life sciences)  . Smokeless tobacco: Never Used  . Tobacco comment: quit with pregnancy  Substance and Sexual Activity  . Alcohol use: No  . Drug use: No  . Sexual activity: Yes    Birth control/protection: None  Lifestyle  . Physical activity    Days per week: Not on file    Minutes per session: Not on file  . Stress: Not on file  Relationships  . Social Herbalist on phone:  Not on file    Gets together: Not on file    Attends religious service: Not on file    Active member of club or organization: Not on file    Attends meetings of clubs or organizations: Not on file    Relationship status: Not on file  Other Topics Concern  . Not on file  Social History Narrative  . Not on file    Her Allergies Are:  No Known Allergies:   Her Current Medications Are:  Outpatient Encounter Medications as of 01/27/2019  Medication Sig  . Aspirin-Salicylamide-Caffeine (BC HEADACHE POWDER PO) Take 1 packet by mouth 3 (three) times daily as needed. For headache or pain  . ibuprofen (ADVIL,MOTRIN) 200 MG tablet Take 200-400 mg by mouth every 6 (six) hours as needed for moderate pain.   . [DISCONTINUED] predniSONE (DELTASONE) 10 MG tablet Take 1 tablet (10 mg total) by mouth daily.  . [DISCONTINUED] predniSONE  (DELTASONE) 20 MG tablet Take 3 tablets (60 mg total) by mouth daily.   No facility-administered encounter medications on file as of 01/27/2019.   :  Review of Systems:  Out of a complete 14 point review of systems, all are reviewed and negative with the exception of these symptoms as listed below: Review of Systems  Neurological:       Pt presents today to discuss her sleep. Pt has never had a sleep study but does endorse snoring.  Epworth Sleepiness Scale 0= would never doze 1= slight chance of dozing 2= moderate chance of dozing 3= high chance of dozing  Sitting and reading: 0 Watching TV: 1 Sitting inactive in a public place (ex. Theater or meeting): 0 As a passenger in a car for an hour without a break: 1 Lying down to rest in the afternoon: 2 Sitting and talking to someone: 0 Sitting quietly after lunch (no alcohol): 0 In a car, while stopped in traffic: 0 Total: 4  FSS: 12     Objective:  Neurological Exam  Physical Exam Physical Examination:   Vitals:   01/27/19 1530  BP: 135/80  Pulse: 96    General Examination: The patient is a very pleasant 48 y.o. female in no acute distress. She appears well-developed and well-nourished and well groomed.   HEENT: Normocephalic, atraumatic, pupils are equal, round and reactive to light and accommodation. Extraocular tracking is good without limitation to gaze excursion or nystagmus noted. Normal smooth pursuit is noted. Hearing is grossly intact. Face is symmetric with normal facial animation and normal facial sensation. Speech is clear with no dysarthria noted. There is no hypophonia. There is no lip, neck/head, jaw or voice tremor. Neck is supple with full range of passive and active motion. There are no carotid bruits on auscultation. Oropharynx exam reveals: mild mouth dryness, adequate dental hygiene and moderate airway crowding, due to Small airway entry and tonsils of about 2+ bilaterally, left side easier to see,  elongated tongue but unremarkable otherwise.  Tongue protrudes centrally in palate elevates symmetrically, Mallampati is class I.  Neck circumference is 13 and three-quarter inches.  Chest: Clear to auscultation without wheezing, rhonchi or crackles noted.  Heart: S1+S2+0, regular and normal without murmurs, rubs or gallops noted.   Abdomen: Soft, non-tender and non-distended with normal bowel sounds appreciated on auscultation.  Extremities: There is no pitting edema in the distal lower extremities bilaterally.  Skin: Warm and dry without trophic changes.  Musculoskeletal: exam reveals no obvious joint deformities, tenderness or joint swelling or erythema.  Neurologically:  Mental status: The patient is awake, alert and oriented in all 4 spheres. Her immediate and remote memory, attention, language skills and fund of knowledge are appropriate. There is no evidence of aphasia, agnosia, apraxia or anomia. Speech is clear with normal prosody and enunciation. Thought process is linear. Mood is normal and affect is normal.  Cranial nerves II - XII are as described above under HEENT exam. In addition: shoulder shrug is normal with equal shoulder height noted. Motor exam: Normal bulk, strength and tone is noted. There is no drift, tremor or rebound. Romberg is negative. Fine motor skills and coordination: grossly intact.  Cerebellar testing: No dysmetria or intention tremor on finger to nose testing. Heel to shin is unremarkable bilaterally. There is no truncal or gait ataxia.  Sensory exam: intact to light touch in the upper and lower extremities.  Gait, station and balance: She stands easily. No veering to one side is noted. No leaning to one side is noted. Posture is age-appropriate and stance is narrow based. Gait shows normal stride length and normal pace. No problems turning are noted.  Assessment and plan:  In summary, Jaclyn Wright is a very pleasant 48 y.o.-year old female with an underlying  medical history of morbid obesity with a BMI of over 45, currently pregnant in the second trimester, whose history and physical exam are concerning for obstructive sleep apnea (OSA). I had a long chat with the patient about my findings and the diagnosis of OSA, its prognosis and treatment options. We talked about medical treatments, surgical interventions and non-pharmacological approaches. I explained in particular the risks and ramifications of untreated moderate to severe OSA, especially with respect to developing cardiovascular disease down the Road, including congestive heart failure, difficult to treat hypertension, cardiac arrhythmias, or stroke. Even type 2 diabetes has, in part, been linked to untreated OSA. Symptoms of untreated OSA include daytime sleepiness, memory problems, mood irritability and mood disorder such as depression and anxiety, lack of energy, as well as recurrent headaches, especially morning headaches. We talked about The importance of maintaining a healthy lifestyle, as well as the importance of weight control. I encouraged the patient to eat healthy, exercise daily and keep well hydrated, to keep a scheduled bedtime and wake time routine, to not skip any meals and eat healthy snacks in between meals. I advised the patient not to drive when feeling sleepy. I recommended the following at this time: sleep study.   I explained the sleep test procedure to the patient and also outlined possible surgical and non-surgical treatment options of OSA, including the use of CPAP. She would be willing to try CPAP if the need arises. I explained the importance of being compliant with PAP treatment, not only for insurance purposes but primarily to improve Her symptoms, and for the patient's long term health benefit, including to reduce Her cardiovascular risks. I answered all her questions today and the patient was in agreement. I plan to see her back after the sleep study is completed and  encouraged her to call with any interim questions, concerns, problems or updates.   Thank you very much for allowing me to participate in the care of this nice patient. If I can be of any further assistance to you please do not hesitate to call me at [phone].  Sincerely,   Huston FoleySaima Toyoko Silos, MD, PhD

## 2019-02-07 ENCOUNTER — Inpatient Hospital Stay (HOSPITAL_COMMUNITY)
Admission: AD | Admit: 2019-02-07 | Discharge: 2019-02-07 | Disposition: A | Discharge status: Home / Self Care | Payer: BC Managed Care – PPO | Attending: Obstetrics and Gynecology | Admitting: Obstetrics and Gynecology

## 2019-02-07 ENCOUNTER — Encounter (HOSPITAL_COMMUNITY): Payer: Self-pay

## 2019-02-07 ENCOUNTER — Other Ambulatory Visit: Payer: Self-pay

## 2019-02-07 DIAGNOSIS — Z7982 Long term (current) use of aspirin: Secondary | ICD-10-CM | POA: Diagnosis not present

## 2019-02-07 DIAGNOSIS — O4402 Placenta previa specified as without hemorrhage, second trimester: Secondary | ICD-10-CM | POA: Diagnosis not present

## 2019-02-07 DIAGNOSIS — R109 Unspecified abdominal pain: Secondary | ICD-10-CM | POA: Diagnosis present

## 2019-02-07 DIAGNOSIS — Z87891 Personal history of nicotine dependence: Secondary | ICD-10-CM | POA: Diagnosis not present

## 2019-02-07 DIAGNOSIS — Z3A17 17 weeks gestation of pregnancy: Secondary | ICD-10-CM | POA: Diagnosis not present

## 2019-02-07 DIAGNOSIS — O26892 Other specified pregnancy related conditions, second trimester: Secondary | ICD-10-CM

## 2019-02-07 DIAGNOSIS — O34219 Maternal care for unspecified type scar from previous cesarean delivery: Secondary | ICD-10-CM

## 2019-02-07 DIAGNOSIS — N949 Unspecified condition associated with female genital organs and menstrual cycle: Secondary | ICD-10-CM

## 2019-02-07 LAB — URINALYSIS, ROUTINE W REFLEX MICROSCOPIC
Bilirubin Urine: NEGATIVE
Glucose, UA: NEGATIVE mg/dL
Hgb urine dipstick: NEGATIVE
Ketones, ur: 5 mg/dL — AB
Leukocytes,Ua: NEGATIVE
Nitrite: NEGATIVE
Protein, ur: NEGATIVE mg/dL
Specific Gravity, Urine: 1.011 (ref 1.005–1.030)
pH: 7 (ref 5.0–8.0)

## 2019-02-07 NOTE — ED Triage Notes (Addendum)
Pt coming in for eval of pelvic pressure, no discharge. She is [redacted] weeks pregnant with confirmed test at doctors office. Hand off given to [facility].

## 2019-02-07 NOTE — MAU Note (Signed)
Jaclyn Wright is a 48 y.o. at [redacted]w[redacted]d here in MAU reporting: states she fell Thursday night and now is having some pressure. Had BM and that helped with the pressure. States she has been feeling flutters. No bleeding, no abnormal discharge.   Onset of complaint: yesterday  Pain score: 0/10  Vitals:   02/07/19 1109  BP: 120/70  Pulse: 90  Resp: 18  Temp: 98.2 F (36.8 C)  SpO2: 100%   FHT: doppler attempted    Lab orders placed from triage: UA

## 2019-02-07 NOTE — MAU Provider Note (Signed)
History     Chief Complaint  Patient presents with  . Abdominal pressure     OB History    Gravida  6   Para  4   Term  4   Preterm  0   AB  1   Living  4     SAB  0   TAB  1   Ectopic  0   Multiple  0   Live Births              Past Medical History:  Diagnosis Date  . No pertinent past medical history     Past Surgical History:  Procedure Laterality Date  . CESAREAN SECTION    . TUBAL LIGATION    . tubal reversal      Family History  Problem Relation Age of Onset  . Anesthesia problems Neg Hx     Social History   Tobacco Use  . Smoking status: Former Research scientist (life sciences)  . Smokeless tobacco: Never Used  . Tobacco comment: quit with pregnancy  Substance Use Topics  . Alcohol use: No  . Drug use: No    Allergies: No Known Allergies  Medications Prior to Admission  Medication Sig Dispense Refill Last Dose  . Aspirin-Salicylamide-Caffeine (BC HEADACHE POWDER PO) Take 1 packet by mouth 3 (three) times daily as needed. For headache or pain     . ibuprofen (ADVIL,MOTRIN) 200 MG tablet Take 200-400 mg by mouth every 6 (six) hours as needed for moderate pain.         Physical Exam   Blood pressure 120/70, pulse 90, temperature 98.2 F (36.8 C), temperature source Oral, resp. rate 18, height 5\' 4"  (1.626 m), weight 123.4 kg, SpO2 100 %.  General appearance: alert, cooperative and no distress Abdomen: gravid obese (+) FHR 140   Pelvic: deferred ED Course  Pelvic pressure Multiparity Placenta previa P) u/a, ucx. Belly band recommended d/c home MDM   Marvene Staff, MD 11:25 AM 02/07/2019

## 2019-02-07 NOTE — Progress Notes (Signed)
Dr Garwin Brothers notified of pt's urine results, orders received to discharge home

## 2019-02-08 LAB — CULTURE, OB URINE
Culture: >=10000 — AB
Special Requests: NORMAL

## 2019-02-19 ENCOUNTER — Encounter (HOSPITAL_COMMUNITY): Payer: Self-pay

## 2019-02-26 ENCOUNTER — Encounter: Payer: Self-pay | Admitting: Internal Medicine

## 2019-02-26 ENCOUNTER — Ambulatory Visit (INDEPENDENT_AMBULATORY_CARE_PROVIDER_SITE_OTHER): Payer: BC Managed Care – PPO | Admitting: Internal Medicine

## 2019-02-26 ENCOUNTER — Telehealth: Payer: Self-pay | Admitting: General Surgery

## 2019-02-26 ENCOUNTER — Ambulatory Visit: Payer: BC Managed Care – PPO

## 2019-02-26 ENCOUNTER — Other Ambulatory Visit: Payer: Self-pay

## 2019-02-26 DIAGNOSIS — R0609 Other forms of dyspnea: Secondary | ICD-10-CM

## 2019-02-26 DIAGNOSIS — R05 Cough: Secondary | ICD-10-CM | POA: Diagnosis not present

## 2019-02-26 DIAGNOSIS — R058 Other specified cough: Secondary | ICD-10-CM

## 2019-02-26 LAB — CBC WITH DIFFERENTIAL/PLATELET
Basophils Absolute: 0 10*3/uL (ref 0.0–0.1)
Basophils Relative: 0.2 % (ref 0.0–3.0)
Eosinophils Absolute: 0.1 10*3/uL (ref 0.0–0.7)
Eosinophils Relative: 0.9 % (ref 0.0–5.0)
HCT: 33.4 % — ABNORMAL LOW (ref 36.0–46.0)
Hemoglobin: 11.1 g/dL — ABNORMAL LOW (ref 12.0–15.0)
Lymphocytes Relative: 18.2 % (ref 12.0–46.0)
Lymphs Abs: 1.9 10*3/uL (ref 0.7–4.0)
MCHC: 33.2 g/dL (ref 30.0–36.0)
MCV: 91.1 fl (ref 78.0–100.0)
Monocytes Absolute: 1 10*3/uL (ref 0.1–1.0)
Monocytes Relative: 9.3 % (ref 3.0–12.0)
Neutro Abs: 7.6 10*3/uL (ref 1.4–7.7)
Neutrophils Relative %: 71.4 % (ref 43.0–77.0)
Platelets: 322 10*3/uL (ref 150.0–400.0)
RBC: 3.66 Mil/uL — ABNORMAL LOW (ref 3.87–5.11)
RDW: 13.4 % (ref 11.5–15.5)
WBC: 10.7 10*3/uL — ABNORMAL HIGH (ref 4.0–10.5)

## 2019-02-26 LAB — HEPATIC FUNCTION PANEL
ALT: 8 U/L (ref 0–35)
AST: 13 U/L (ref 0–37)
Albumin: 3.6 g/dL (ref 3.5–5.2)
Alkaline Phosphatase: 39 U/L (ref 39–117)
Bilirubin, Direct: 0 mg/dL (ref 0.0–0.3)
Total Bilirubin: 0.3 mg/dL (ref 0.2–1.2)
Total Protein: 6.9 g/dL (ref 6.0–8.3)

## 2019-02-26 LAB — BASIC METABOLIC PANEL
BUN: 9 mg/dL (ref 6–23)
CO2: 25 mEq/L (ref 19–32)
Calcium: 9.3 mg/dL (ref 8.4–10.5)
Chloride: 105 mEq/L (ref 96–112)
Creatinine, Ser: 0.51 mg/dL (ref 0.40–1.20)
GFR: 155.82 mL/min (ref >60.00)
Glucose, Bld: 82 mg/dL (ref 70–99)
Potassium: 3.7 mEq/L (ref 3.5–5.1)
Sodium: 138 mEq/L (ref 135–145)

## 2019-02-26 LAB — TSH: TSH: 0.95 u[IU]/mL (ref 0.35–4.50)

## 2019-02-26 LAB — BRAIN NATRIURETIC PEPTIDE: Pro B Natriuretic peptide (BNP): 60 pg/mL (ref 0.0–100.0)

## 2019-02-26 NOTE — Patient Instructions (Signed)
GERD (REFLUX)  is an extremely common cause of respiratory symptoms just like yours , many times with no obvious heartburn at all.    It can be treated with medication, but also with lifestyle changes including elevation of the head of your bed (ideally with 6 -8inch blocks under the headboard of your bed),  Smoking cessation, avoidance of late meals, excessive alcohol, and avoid fatty foods, chocolate, peppermint, colas, red wine, and acidic juices such as orange juice.  NO MINT OR MENTHOL PRODUCTS SO NO COUGH DROPS  USE SUGARLESS CANDY INSTEAD (Jolley ranchers or Stover's or Life Savers) or even ice chips will also do - the key is to swallow to prevent all throat clearing. NO OIL BASED VITAMINS - use powdered substitutes.  Avoid fish oil when coughing.    Please remember to go to the lab and x-ray department   for your tests - we will call you with the results when they are available.    Please schedule a follow up office visit in 4 weeks, sooner if needed

## 2019-02-26 NOTE — Progress Notes (Signed)
Ria ClockAlice M Mincey, female    DOB: 12-18-1970      MRN: [id number]   Brief patient profile:  47 yobf quit smoking 08/2011  With last IUP uncomplicated in  1999 and baseline wt = 155 and gained up to 260 with some noct snoore / wheeze but lost down around 240 and even able to do steps no problem then IVF and doe w/in a month of IUP and also snoring on back and assoc with subjective wheezing   plus indolent onset progressive doe = MMRC1 = can walk nl pace, flat grade, can't hurry or go uphills or steps s sob  So referred to pulmonary clinic 02/26/2019 by Dr   Wonda Oldsousin     History of Present Illness  02/26/2019  Pulmonary/ 1st office eval/Annalea Alguire  Doe at 20 week IUP  Chief Complaint  Patient presents with   Pulmonary Consult    Referred by Dr Cherly Hensenousins. Pt 5 months pregnant. She c/o SOB x 5 months with "too much walking", stairs, walking an incline.    Dyspnea:  MMRC1 = can walk nl pace, flat grade, can't hurry or go uphills or steps s sob   Cough: sense of pnds/ throat congestion  X sev months sometimes brings up slightly bloody mucus  Sleep: on side sleeping on couch, can't lie flat anymore SABA use: none   No obvious day to day or daytime variability or assoc   purulent sputum or mucus plugs   or cp or chest tightness,  or overt   hb symptoms.   Also denies any obvious fluctuation of symptoms with weather or environmental changes or other aggravating or alleviating factors except as outlined above   No unusual exposure hx or h/o childhood pna/ asthma or knowledge of premature birth.  Current Allergies, Complete Past Medical History, Past Surgical History, Family History, and Social History were reviewed in [facility] electronic medical record.  ROS  The following are not active complaints unless bolded Hoarseness, sore throat, dysphagia, dental problems, itching, sneezing,  nasal congestion or discharge of excess mucus or purulent secretions, ear ache,   fever, chills, sweats, unintended wt  loss or wt gain, classically pleuritic or exertional cp,  orthopnea pnd or arm/hand swelling  or leg swelling, presyncope, palpitations, abdominal pain, anorexia, nausea, vomiting, diarrhea  or change in bowel habits or change in bladder habits, change in stools or change in urine, dysuria, hematuria,  rash, arthralgias, visual complaints, headache, numbness, weakness or ataxia or problems with walking or coordination,  change in mood or  memory.             Past Medical History:  Diagnosis Date   No pertinent past medical history     No outpatient medications prior to visit.   No facility-administered medications prior to visit.      Objective:     BP 122/68 (BP Location: Left Arm, Cuff Size: Large)    Pulse (!) 108    Ht 5\' 4"  (1.626 m)    Wt 278 lb (126.1 kg)    SpO2 99%    BMI 47.72 kg/m   SpO2: 99 %  RA   - note resting pulse   Obese amb bf with occ vigorous throat clearing   HEENT: nl dentition, turbinates bilaterally, and oropharynx. Nl external ear canals without cough reflex   NECK :  without JVD/Nodes/TM/ nl carotid upstrokes bilaterally   LUNGS: no acc muscle use,  Nl contour chest which is clear to A and  P bilaterally without cough on insp or exp maneuvers   CV:  RRR  no s3 or murmur or increase in P2, and no edema   ABD:  C/w IUP of stated gestational age with nl inspiratory excursion in the supine position. No bruits or organomegaly appreciated, bowel sounds nl  MS:  Nl gait/ ext warm without deformities, calf tenderness, cyanosis or clubbing No obvious joint restrictions   SKIN: warm and dry without lesions    NEURO:  alert, approp, nl sensorium with  no motor or cerebellar deficits apparent.    CXR PA and Lateral:   02/26/2019 :    I personally reviewed images and agree with radiology impression as follows:  Declined cxr p full disclosure risk/benefit ratio favors doing it at 20 week IUP with lead apron given dx uncertainty of doe   Labs ordered/  reviewed:      Chemistry      Component Value Date/Time   NA 138 02/26/2019 1509   K 3.7 02/26/2019 1509   CL 105 02/26/2019 1509   CO2 25 02/26/2019 1509   BUN 9 02/26/2019 1509   CREATININE 0.51 02/26/2019 1509      Component Value Date/Time   CALCIUM 9.3 02/26/2019 1509   ALKPHOS 39 02/26/2019 1509   AST 13 02/26/2019 1509   ALT 8 02/26/2019 1509   BILITOT 0.3 02/26/2019 1509        Lab Results  Component Value Date   WBC 10.7 (H) 02/26/2019   HGB 11.1 (L) 02/26/2019   HCT 33.4 (L) 02/26/2019   MCV 91.1 02/26/2019   PLT 322.0 02/26/2019       EOS                                                               0.1                                    02/26/2019   No results found for: DDIMER    Lab Results  Component Value Date   TSH 0.95 02/26/2019     Lab Results  Component Value Date   PROBNP 60.0 02/26/2019          Assessment   DOE (dyspnea on exertion) Onset 12/2018 @ about 2 months into IUP at age 48 and baseline wt up over 100 lb higher than prior IUP - 02/26/2019   Walked RA  2 laps @  approx 24550ft each @ moderate  pace  stopped due to  End of study, min sob, sats 96%    No evidence of abn buffering capacity, anemia, thyroid dz or chf or asthma  based on initial eval and note pt declined cxr but really this is optional at this point   Advised that not healthy for 02 delivery for her to be more than mildly sob and may need additional w/u but for now rec rx the only obvious assoc symptom = GERD, due to wt, progesterone and aging, and consider more aggressive rx for gerd and w/u for sob with possibly echo and v/q if convincing new symptoms develop but for now I'm confident that this is about what I would expect given the above setting  of advanced age and obesity for contributing to doe.       Upper airway cough syndrome Onset with IUP 12/2018  - 02/26/2019 rec max diet then add ppi next per OB if not better  Upper airway cough syndrome (previously  labeled PNDS),  is so named because it's frequently impossible to sort out how much is  CR/sinusitis with freq throat clearing (which can be related to primary GERD)   vs  causing  secondary (" extra esophageal")  GERD from wide swings in gastric pressure that occur with throat clearing, often  promoting self use of mint and menthol lozenges that reduce the lower esophageal sphincter tone and exacerbate the problem further in a cyclical fashion.   These are the same pts (now being labeled as having "irritable larynx syndrome" by some cough centers) who not infrequently have a history of having failed to tolerate ace inhibitors,  dry powder inhalers or biphosphonates or report having atypical/extraesophageal reflux symptoms that don't respond to standard doses of PPI  and are easily confused as having aecopd or asthma flares by even experienced allergists/ pulmonologists (myself included).    See above rx as initial rx  F/u 4 weeks     Total time devoted to counseling  > 50 % of initial 60 min office visit:  review case with pt/  directly observed portions of ambulatory 02 saturation study/ discussion of options/alternatives/ personally creating written customized instructions  in presence of pt  then going over those specific  Instructions directly with the pt including how to use all of the meds but in particular covering each new medication in detail and the difference between the maintenance= "automatic" meds and the prns using an action plan format for the latter (If this problem/symptom => do that organization reading Left to right).  Please see AVS from this visit for a full list of these instructions which I personally wrote for this pt and  are unique to this visit.    Christinia Gully, MD 02/26/2019

## 2019-02-27 ENCOUNTER — Telehealth: Payer: Self-pay | Admitting: Internal Medicine

## 2019-02-27 NOTE — Telephone Encounter (Signed)
Per pt request lab result left on vmail. Studies are unremarkable, no change in recs  Nothing further.

## 2019-02-28 ENCOUNTER — Encounter: Payer: Self-pay | Admitting: Internal Medicine

## 2019-02-28 DIAGNOSIS — R058 Other specified cough: Secondary | ICD-10-CM | POA: Insufficient documentation

## 2019-02-28 DIAGNOSIS — R05 Cough: Secondary | ICD-10-CM | POA: Insufficient documentation

## 2019-02-28 NOTE — Assessment & Plan Note (Signed)
Onset 12/2018 @ about 2 months into IUP at age 48 and baseline wt up over 100 lb higher than prior IUP - 02/26/2019   Walked RA  2 laps @  approx 252ft each @ moderate  pace  stopped due to  End of study, min sob, sats 96%    No evidence of abn buffering capacity, anemia, thyroid dz or chf or asthma  based on initial eval and note pt declined cxr but really this is optional at this point   Advised that not healthy for 02 delivery for her to be more than mildly sob and may need additional w/u but for now rec rx the only obvious assoc symptom = GERD, due to wt, progesterone and aging, and consider more aggressive rx for gerd and w/u for sob with possibly echo and v/q if convincing new symptoms develop but for now I'm confident that this is about what I would expect given the above setting of advanced age and obesity for contributing to doe.

## 2019-02-28 NOTE — Assessment & Plan Note (Signed)
Onset with IUP 12/2018  - 02/26/2019 rec max diet then add ppi next per OB if not better  Upper airway cough syndrome (previously labeled PNDS),  is so named because it's frequently impossible to sort out how much is  CR/sinusitis with freq throat clearing (which can be related to primary GERD)   vs  causing  secondary (" extra esophageal")  GERD from wide swings in gastric pressure that occur with throat clearing, often  promoting self use of mint and menthol lozenges that reduce the lower esophageal sphincter tone and exacerbate the problem further in a cyclical fashion.   These are the same pts (now being labeled as having "irritable larynx syndrome" by some cough centers) who not infrequently have a history of having failed to tolerate ace inhibitors,  dry powder inhalers or biphosphonates or report having atypical/extraesophageal reflux symptoms that don't respond to standard doses of PPI  and are easily confused as having aecopd or asthma flares by even experienced allergists/ pulmonologists (myself included).    See above rx as initial rx  F/u 4 weeks    Total time devoted to counseling  > 50 % of initial 60 min office visit:  review case with pt/  directly observed portions of ambulatory 02 saturation study/ discussion of options/alternatives/ personally creating written customized instructions  in presence of pt  then going over those specific  Instructions directly with the pt including how to use all of the meds but in particular covering each new medication in detail and the difference between the maintenance= "automatic" meds and the prns using an action plan format for the latter (If this problem/symptom => do that organization reading Left to right).  Please see AVS from this visit for a full list of these instructions which I personally wrote for this pt and  are unique to this visit.

## 2019-03-19 ENCOUNTER — Other Ambulatory Visit: Payer: Self-pay

## 2019-03-19 ENCOUNTER — Other Ambulatory Visit (HOSPITAL_COMMUNITY): Payer: Self-pay | Admitting: Obstetrics and Gynecology

## 2019-03-19 ENCOUNTER — Ambulatory Visit (INDEPENDENT_AMBULATORY_CARE_PROVIDER_SITE_OTHER): Payer: Medicaid Other | Admitting: Neurology

## 2019-03-19 DIAGNOSIS — R5383 Other fatigue: Secondary | ICD-10-CM

## 2019-03-19 DIAGNOSIS — R519 Headache, unspecified: Secondary | ICD-10-CM

## 2019-03-19 DIAGNOSIS — G4720 Circadian rhythm sleep disorder, unspecified type: Secondary | ICD-10-CM

## 2019-03-19 DIAGNOSIS — O09522 Supervision of elderly multigravida, second trimester: Secondary | ICD-10-CM

## 2019-03-19 DIAGNOSIS — O280 Abnormal hematological finding on antenatal screening of mother: Secondary | ICD-10-CM

## 2019-03-19 DIAGNOSIS — G4733 Obstructive sleep apnea (adult) (pediatric): Secondary | ICD-10-CM

## 2019-03-19 DIAGNOSIS — G4700 Insomnia, unspecified: Secondary | ICD-10-CM

## 2019-03-19 DIAGNOSIS — R0683 Snoring: Secondary | ICD-10-CM

## 2019-03-19 DIAGNOSIS — R351 Nocturia: Secondary | ICD-10-CM

## 2019-03-19 DIAGNOSIS — G479 Sleep disorder, unspecified: Secondary | ICD-10-CM

## 2019-03-19 DIAGNOSIS — Z6841 Body Mass Index (BMI) 40.0 and over, adult: Secondary | ICD-10-CM

## 2019-03-19 DIAGNOSIS — Z3A25 25 weeks gestation of pregnancy: Secondary | ICD-10-CM

## 2019-03-24 ENCOUNTER — Ambulatory Visit (HOSPITAL_COMMUNITY): Payer: BC Managed Care – PPO

## 2019-03-24 ENCOUNTER — Encounter (HOSPITAL_COMMUNITY): Payer: BC Managed Care – PPO

## 2019-03-26 NOTE — Progress Notes (Signed)
Patient referred by Dr. Garwin Brothers, her OB/GYN, seen by me on 01/27/19, diagnostic PSG on 03/19/19.   Please call and notify the patient that the recent sleep study did not show any significant obstructive sleep apnea with the exception of intermittent, mild to moderate snoring and REM related OSA; for this, CPAP therapy is not warranted. Appropriate weight control during pregnancy and post-partum weight loss is recommended and will likely reduce snoring and her REM related OSA. She can FU with Dr. Garwin Brothers as planned.   Thanks,  Jaclyn Age, MD, PhD [facility])

## 2019-03-26 NOTE — Procedures (Signed)
PATIENT'S NAME:  Kelby, Lotspeich DOB:      [birth year]      MR#:    [id number]     DATE OF RECORDING: 03/19/2019 REFERRING M.D.:  Servando Salina, MD Study Performed:   Baseline Polysomnogram HISTORY: 48 year old woman with a history of morbid obesity with a BMI of over 45, currently pregnant in the second trimester, who reports snoring and difficulty with sleep maintenance, daytime tiredness. Her Epworth sleepiness score is 4 out of 24, fatigue severity score is 12 out of 63. The patient's weight 270 pounds with a height of 64 (inches), resulting in a BMI of 45.9 kg/m2. The patient's neck circumference measured 13.8 inches.  CURRENT MEDICATIONS: Bc headache powder, Ibuprofen   PROCEDURE:  This is a multichannel digital polysomnogram utilizing the Somnostar 11.2 system.  Electrodes and sensors were applied and monitored per AASM Specifications.   EEG, EOG, Chin and Limb EMG, were sampled at 200 Hz.  ECG, Snore and Nasal Pressure, Thermal Airflow, Respiratory Effort, CPAP Flow and Pressure, Oximetry was sampled at 50 Hz. Digital video and audio were recorded.      BASELINE STUDY  Lights Out was at 21:48 and Lights On at 05:02.  Total recording time (TRT) was 434 minutes, with a total sleep time (TST) of 289 minutes.   The patient's sleep latency was 22.5 minutes.  REM latency was 58.5 minutes, which is mildly reduced. The sleep efficiency was 66.6%.     SLEEP ARCHITECTURE: WASO (Wake after sleep onset) was 126 minutes with one longer period of wakefulness noted, and otherwise minimal sleep fragmentation. There were 9 minutes in Stage N1, 207.5 minutes Stage N2, 33 minutes Stage N3 and 39.5 minutes in Stage REM.  The percentage of Stage N1 was 3.1%, Stage N2 was 71.8%, which is increased, Stage N3 was 11.4% and Stage R (REM sleep) was 13.7%, which is mildly reduced. The arousals were noted as: 26 were spontaneous, 0 were associated with PLMs, 3 were associated with respiratory events.  RESPIRATORY  ANALYSIS:  There were a total of 17 respiratory events:  0 obstructive apneas, 0 central apneas and 3 mixed apneas with a total of 3 apneas and an apnea index (AI) of .6 /hour. There were 14 hypopneas with a hypopnea index of 2.9 /hour. The patient also had 0 respiratory event related arousals (RERAs).      The total APNEA/HYPOPNEA INDEX (AHI) was 3.5/hour and the total RESPIRATORY DISTURBANCE INDEX was 3.5 /hour.  17 events occurred in REM sleep and 0 events in NREM. The REM AHI was 25.8 /hour, versus a non-REM AHI of 0. The patient spent 21.5 minutes of total sleep time in the supine position and 268 minutes in non-supine.. The supine AHI was 0.0 versus a non-supine AHI of 3.8.  OXYGEN SATURATION & C02:  The Wake baseline 02 saturation was 98%, with the lowest being 85%. Time spent below 89% saturation equaled 5 minutes.    PERIODIC LIMB MOVEMENTS: The patient had a total of 0 Periodic Limb Movements.  The Periodic Limb Movement (PLM) index was 0 and the PLM Arousal index was 0/hour.  Audio and video analysis did not show any abnormal or unusual movements, behaviors, phonations or vocalizations. The patient took one bathroom break. Mild to moderate snoring was noted, intermittent. The EKG was in keeping with normal sinus rhythm (NSR).  Post-study, the patient indicated that sleep was the same as usual.   IMPRESSION:  1. Primary Snoring 2. Dysfunctions associated with sleep stages or arousal  from sleep  RECOMMENDATIONS:  1. This study does not demonstrate any significant obstructive or central sleep disordered breathing with the exception of intermittent, mild to moderate snoring and REM related OSA; for this, CPAP therapy is not warranted. Appropriate weight control during pregnancy and post-partum weight loss is recommended and will likely reduce snoring and her REM related OSA.  2. This study shows some sleep fragmentation and mildly abnormal sleep stage percentages; these are nonspecific  findings and per se do not signify an intrinsic sleep disorder or a cause for the patient's sleep-related symptoms. Causes include (but are not limited to) the first night effect of the sleep study, circadian rhythm disturbances, medication effect or an underlying mood disorder or medical problem.  3. The patient should be cautioned not to drive, work at heights, or operate dangerous or heavy equipment when tired or sleepy. Review and reiteration of good sleep hygiene measures should be pursued with any patient. 4. The patient will be advised to follow up with the referring provider, who will be notified of the test results.  I certify that I have reviewed the entire raw data recording prior to the issuance of this report in accordance with the Standards of Accreditation of the American Academy of Sleep Medicine (AASM)   Huston FoleySaima Iolanda Folson, MD, PhD Diplomat, American Board of Neurology and Sleep Medicine (Neurology and Sleep Medicine)

## 2019-03-30 ENCOUNTER — Telehealth: Payer: Self-pay

## 2019-03-30 NOTE — Telephone Encounter (Signed)
I called pt to discuss her sleep study results. No answer, left a message asking her to call me back. 

## 2019-03-30 NOTE — Telephone Encounter (Signed)
-----   Message from Star Age, MD sent at 03/26/2019  3:49 PM EDT ----- Patient referred by Dr. Garwin Brothers, her OB/GYN, seen by me on 01/27/19, diagnostic PSG on 03/19/19.   Please call and notify the patient that the recent sleep study did not show any significant obstructive sleep apnea with the exception of intermittent, mild to moderate snoring and REM related OSA; for this, CPAP therapy is not warranted. Appropriate weight control during pregnancy and post-partum weight loss is recommended and will likely reduce snoring and her REM related OSA. She can FU with Dr. Garwin Brothers as planned.   Thanks,  Star Age, MD, PhD [facility])

## 2019-03-31 ENCOUNTER — Encounter: Payer: Self-pay | Admitting: Internal Medicine

## 2019-03-31 ENCOUNTER — Other Ambulatory Visit: Payer: Self-pay

## 2019-03-31 ENCOUNTER — Ambulatory Visit: Payer: BC Managed Care – PPO | Admitting: Internal Medicine

## 2019-03-31 DIAGNOSIS — R058 Other specified cough: Secondary | ICD-10-CM

## 2019-03-31 DIAGNOSIS — R0609 Other forms of dyspnea: Secondary | ICD-10-CM | POA: Diagnosis not present

## 2019-03-31 DIAGNOSIS — R05 Cough: Secondary | ICD-10-CM

## 2019-03-31 NOTE — Patient Instructions (Signed)
I strongly recommend 8 inch bed blocks under the headboards or sleep in recliner  Please schedule a follow up office visit in 4 weeks, sooner if needed

## 2019-03-31 NOTE — Progress Notes (Signed)
Jaclyn Wright, female    DOB: 01/25/1971      MRN: [id number]   Brief patient profile:  4 yobf quit smoking 08/2011  With last IUP uncomplicated in  2542 and baseline wt = 155 and gained up to 260 with some noct snoore / wheeze but lost down around 240 and even able to do steps no problem then IVF and doe w/in a month of IUP and also snoring on back and assoc with subjective wheezing   plus indolent onset progressive doe = MMRC1 = can walk nl pace, flat grade, can't hurry or go uphills or steps s sob  So referred to pulmonary clinic 02/26/2019 by Dr   Maudry Diego     History of Present Illness  02/26/2019  Pulmonary/ 1st office eval/  Doe at 25 week IUP  Chief Complaint  Patient presents with  . Pulmonary Consult    Referred by Dr Garwin Brothers. Pt 5 months pregnant. She c/o SOB x 5 months with "too much walking", stairs, walking an incline.   Dyspnea:  MMRC1 = can walk nl pace, flat grade, can't hurry or go uphills or steps s sob   Cough: sense of pnds/ throat congestion  X sev months sometimes brings up slightly bloody mucus  Sleep: on side sleeping on couch, can't lie flat anymore SABA use: none rec GERD  Please remember to go to the lab and x-ray department   for your tests - we will call you with the results when they are available.   03/31/2019  f/u ov/ re:  6 months IUP  Chief Complaint  Patient presents with  . Follow-up    Patient reports she still has sob with exertion.   Dyspnea:  Full grocery shopping makes her feel breathless  Cough: no excess Sleeping: wheezing sound when lies on back in bed / better on side  SABA use: no 02: no    No obvious day to day or daytime variability or assoc excess/ purulent sputum or mucus plugs or hemoptysis or cp or chest tightness,  or overt sinus or hb symptoms.    Also denies any obvious fluctuation of symptoms with weather or environmental changes or other aggravating or alleviating factors except as outlined above   No unusual  exposure hx or h/o childhood pna/ asthma or knowledge of premature birth.  Current Allergies, Complete Past Medical History, Past Surgical History, Family History, and Social History were reviewed in [employer] record.  ROS  The following are not active complaints unless bolded Hoarseness, sore throat, dysphagia, dental problems, itching, sneezing,  nasal congestion or discharge of excess mucus or purulent secretions, ear ache,   fever, chills, sweats, unintended wt loss or wt gain, classically pleuritic or exertional cp,  orthopnea pnd or arm/hand swelling  or leg swelling, presyncope, palpitations, abdominal pain, anorexia, nausea, vomiting, diarrhea  or change in bowel habits or change in bladder habits, change in stools or change in urine, dysuria, hematuria,  rash, arthralgias, visual complaints, headache, numbness, weakness or ataxia or problems with walking or coordination,  change in mood or  memory.        Taking no meds x for vitamins               Objective:     Wt Readings from Last 3 Encounters:  03/31/19 275 lb (124.7 kg)  02/26/19 278 lb (126.1 kg)  02/07/19 272 lb (123.4 kg)     Vital signs reviewed - Note on  arrival 02 sats  95% on RA and bp 118/78      amb obese bf no longer with vigorous throat clearing  HEENT: nl dentition, turbinates bilaterally, and oropharynx. Nl external ear canals without cough reflex   NECK :  without JVD/Nodes/TM/ nl carotid upstrokes bilaterally   LUNGS: no acc muscle use,  Nl contour chest which is clear to A and P bilaterally without cough on insp or exp maneuvers   CV:  RRR  no s3 or murmur or increase in P2, and trace to 1+ sym pitting both LEs  ABD:  Obese and c/w IUP of stated age gestation,   nontender with nl inspiratory excursion in the supine position. No bruits or organomegaly appreciated, bowel sounds nl  MS:  Nl gait/ ext warm without deformities, calf tenderness, cyanosis or clubbing No  obvious joint restrictions   SKIN: warm and dry without lesions    NEURO:  alert, approp, nl sensorium with  no motor or cerebellar deficits apparent.             Assessment

## 2019-04-01 ENCOUNTER — Encounter: Payer: Self-pay | Admitting: Internal Medicine

## 2019-04-01 NOTE — Assessment & Plan Note (Addendum)
Onset 12/2018 @ about 2 months into IUP at age 48 and baseline wt up over 100 lb higher than prior IUP - 02/26/2019   Walked RA  2 laps @  approx 252ft each @ moderate  pace  stopped due to  End of study, min sob, sats 96%  - 03/31/2019   Walked RA  2 laps @  approx 274ft each @ avg pace  stopped due to end of study s sob and sats 98%     Doe attributable to wt gain with no evidence of asthma or chf clinically though continues to decline shielded  cxr that was rec at last ov

## 2019-04-01 NOTE — Assessment & Plan Note (Addendum)
Onset with IUP 12/2018  - 02/26/2019 rec max diet then add ppi next per OB if not better  Assoc with pseudowheeze no just when on back > rx diet/ position changes at hs with bed blocks preferable, reviewed    If worse consider PPI or h2 blockers per OB  I had an extended discussion with the patient  reviewing all relevant studies completed to date and  lasting 15 to 20 minutes of a 25 minute visit  which included directly observing ambulatory 02 saturation study documented in a/p section of  today's  office note.  Each maintenance medication was reviewed in detail including most importantly the difference between maintenance and prns and under what circumstances the prns are to be triggered using an action plan format that is not reflected in the computer generated alphabetically organized AVS.     Please see AVS for specific instructions unique to this visit that I personally wrote and verbalized to the the pt in detail and then reviewed with pt  by my nurse highlighting any changes in therapy recommended at today's visit .

## 2019-04-02 ENCOUNTER — Other Ambulatory Visit (HOSPITAL_COMMUNITY): Payer: Self-pay | Admitting: *Deleted

## 2019-04-02 ENCOUNTER — Ambulatory Visit (HOSPITAL_COMMUNITY): Payer: BC Managed Care – PPO

## 2019-04-02 ENCOUNTER — Encounter (HOSPITAL_COMMUNITY): Payer: Self-pay

## 2019-04-02 ENCOUNTER — Other Ambulatory Visit: Payer: Self-pay

## 2019-04-02 ENCOUNTER — Ambulatory Visit (HOSPITAL_COMMUNITY): Payer: BC Managed Care – PPO | Admitting: *Deleted

## 2019-04-02 ENCOUNTER — Ambulatory Visit (HOSPITAL_COMMUNITY)
Admission: RE | Admit: 2019-04-02 | Discharge: 2019-04-02 | Disposition: A | Discharge status: Home / Self Care | Payer: BC Managed Care – PPO | Source: Ambulatory Visit | Attending: Obstetrics and Gynecology | Admitting: Obstetrics and Gynecology

## 2019-04-02 VITALS — BP 118/71 | HR 96 | Temp 98.2°F

## 2019-04-02 DIAGNOSIS — O09522 Supervision of elderly multigravida, second trimester: Secondary | ICD-10-CM | POA: Diagnosis present

## 2019-04-02 DIAGNOSIS — O09529 Supervision of elderly multigravida, unspecified trimester: Secondary | ICD-10-CM | POA: Insufficient documentation

## 2019-04-02 DIAGNOSIS — O99212 Obesity complicating pregnancy, second trimester: Secondary | ICD-10-CM | POA: Diagnosis not present

## 2019-04-02 DIAGNOSIS — Z3A25 25 weeks gestation of pregnancy: Secondary | ICD-10-CM | POA: Diagnosis not present

## 2019-04-02 DIAGNOSIS — O34219 Maternal care for unspecified type scar from previous cesarean delivery: Secondary | ICD-10-CM

## 2019-04-02 DIAGNOSIS — O361920 Maternal care for other isoimmunization, second trimester, not applicable or unspecified: Secondary | ICD-10-CM | POA: Diagnosis not present

## 2019-04-02 DIAGNOSIS — O280 Abnormal hematological finding on antenatal screening of mother: Secondary | ICD-10-CM

## 2019-04-02 DIAGNOSIS — R768 Other specified abnormal immunological findings in serum: Secondary | ICD-10-CM

## 2019-04-02 NOTE — Consult Note (Signed)
Maternal-Fetal Medicine  Name: Jaclyn Wright MRN: [id number] Requesting Provider: Dr. Servando Salina, MD  I had the pleasure of seeing Jaclyn Wright today at the [facility]. She is G7 P4 at 25w 1d gestation and is here for ultrasound and consultation. 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 No titers were reported.  Patient had IVF-ET (second attempt). First attempt led to tubal pregnancy and the patient had ruptured tubal pregnancy in [place] (traveling). She had laparoscopic salpingectomy. She received about 5 units of blood during and after surgery.  Obstetric history is significant for 4 previous term cesarean deliveries. Gyn: She had bilateral tubal ligation in 1999 and laparoscopic reversal in 2015. She has a new partner. No history of abnormal Pap smears or cervical surgeries.  PMH: No history of hypertension or diabetes or any other chronic medical conditions. She does not have sickle-cell trait. PSH: Cesarean section, tubal ligation, reversal surgery, salpingectomy. Medications; Prenatal vitamins. Allergies: NKDA. Social: Denies tobacco or drug or alcohol use. Her partner is in good health. Family: No history of venous thromboembolism in the family.  Prenatal: Patient reports she had low risk for fetal aneuploidies on screening. MSAFP screening showed low risk for open-neural tube defects. Early screening ruled out gestational diabetes.  A limited ultrasound study was performed. Amniotic fluid is normal and good fetal activity is seen. Middle-cerebral artery (MCA) Doppler study showed normal peak-systolic velocity measurements (no evidence of fetal anemia). Placenta appears normal and there is no evidence of previa or accreta.  I counseled the patient on the following: Anti-Fya antibodies: -They belong to the Duffy blood group. About two-thirds of Caucasian population have Fya antigen (and about 80% have Fyb antigens), but it is far  less commonly seen in African American population-10% and 20% respectively).  -Anti-Fya antibodies are usually acquired after blood transfusions. Patient has a recent history of blood transfusions. Anti-Fya antibodies can rarely cause hemolytic disease of the newborn. However, mild to severe hemolysis (10% risk) has been reported.  -I explained the significance of MCA Doppler study and reassured her that normal study shows a low likelihood of fetal anemia.   -We recommend MCA Dopplers every 2 weeks for now.  Previous cesarean delivery: I reassured her of today's placental findings. I also informed her that repeat cesarean deliveries increase the risk of placenta previa or accreta in subsequent pregnancies.  Recommendations: -MCA Doppler in 2 weeks and then every 2 weeks. -Detailed fetal anatomy scan next visit.  Thank you for your consult. Please do not hesitate to contact me if you have any questions or concerns.  Consultation including face-to-face counseling: 40 min.

## 2019-04-02 NOTE — Telephone Encounter (Signed)
I called pt again to discuss her sleep study results. No answer, left a message asking her to call me back. 

## 2019-04-03 NOTE — Telephone Encounter (Signed)
Pt returned call. Please call back when possible.

## 2019-04-06 NOTE — Telephone Encounter (Signed)
I called pt and discussed her sleep study results and recommendations. Pt verbalized understanding of results. Pt will follow up with Dr. Garwin Brothers. Pt had no questions at this time but was encouraged to call back if questions arise.

## 2019-04-14 ENCOUNTER — Encounter (HOSPITAL_COMMUNITY): Payer: Self-pay

## 2019-04-14 ENCOUNTER — Ambulatory Visit (HOSPITAL_COMMUNITY): Payer: BC Managed Care – PPO | Admitting: *Deleted

## 2019-04-14 ENCOUNTER — Ambulatory Visit (HOSPITAL_COMMUNITY)
Admission: RE | Admit: 2019-04-14 | Discharge: 2019-04-14 | Disposition: A | Discharge status: Home / Self Care | Payer: BC Managed Care – PPO | Source: Ambulatory Visit | Attending: Obstetrics and Gynecology | Admitting: Obstetrics and Gynecology

## 2019-04-14 ENCOUNTER — Other Ambulatory Visit: Payer: Self-pay

## 2019-04-14 ENCOUNTER — Other Ambulatory Visit (HOSPITAL_COMMUNITY): Payer: Self-pay | Admitting: Obstetrics and Gynecology

## 2019-04-14 VITALS — BP 109/64 | HR 99 | Temp 98.2°F

## 2019-04-14 DIAGNOSIS — O09523 Supervision of elderly multigravida, third trimester: Secondary | ICD-10-CM | POA: Insufficient documentation

## 2019-04-14 DIAGNOSIS — O34219 Maternal care for unspecified type scar from previous cesarean delivery: Secondary | ICD-10-CM

## 2019-04-14 DIAGNOSIS — O09812 Supervision of pregnancy resulting from assisted reproductive technology, second trimester: Secondary | ICD-10-CM

## 2019-04-14 DIAGNOSIS — Z3A26 26 weeks gestation of pregnancy: Secondary | ICD-10-CM

## 2019-04-14 DIAGNOSIS — R768 Other specified abnormal immunological findings in serum: Secondary | ICD-10-CM | POA: Diagnosis present

## 2019-04-14 DIAGNOSIS — O99212 Obesity complicating pregnancy, second trimester: Secondary | ICD-10-CM

## 2019-04-14 DIAGNOSIS — Z363 Encounter for antenatal screening for malformations: Secondary | ICD-10-CM

## 2019-04-14 DIAGNOSIS — O361920 Maternal care for other isoimmunization, second trimester, not applicable or unspecified: Secondary | ICD-10-CM | POA: Diagnosis not present

## 2019-04-30 ENCOUNTER — Ambulatory Visit (HOSPITAL_COMMUNITY): Payer: BC Managed Care – PPO

## 2019-05-05 ENCOUNTER — Ambulatory Visit: Payer: BC Managed Care – PPO | Admitting: Internal Medicine

## 2020-02-23 ENCOUNTER — Ambulatory Visit: Payer: Medicaid Other | Admitting: Family Medicine

## 2020-02-23 ENCOUNTER — Other Ambulatory Visit: Payer: Self-pay | Admitting: Family Medicine

## 2020-02-23 VITALS — BP 116/82 | HR 86 | Ht 66.0 in | Wt 266.0 lb

## 2020-02-23 DIAGNOSIS — R350 Frequency of micturition: Secondary | ICD-10-CM

## 2020-02-23 LAB — POCT URINALYSIS DIPSTICK
Bilirubin, UA: NEGATIVE
Blood, UA: NEGATIVE
Glucose, UA: NEGATIVE
Ketones, UA: NEGATIVE
Leukocytes, UA: NEGATIVE
Nitrite, UA: NEGATIVE
Protein, UA: NEGATIVE
Spec Grav, UA: 1.02 (ref 1.010–1.025)
Urobilinogen, UA: 0.2 E.U./dL
pH, UA: 6 (ref 5.0–8.0)

## 2020-02-23 NOTE — Progress Notes (Signed)
Subjective:     Patient ID: Jaclyn Wright, female   DOB: September 02, 1970, 49 y.o.   MRN: [id number]  HPI  [facility] for evaluation of bilateral low back pain x 2 weeks. She states this feels similar to when she had a UTI previously. States last UTI was about 7 months ago when she was just a few weeks postpartum. She denies any dysuria, hematuria, fever, chills, N/V, abdominal or pelvic pain. She reports some increase in urinary frequency, especially at nighttime. She states she notices the pain in her low back when her bladder is full and is relieved when she empties her bladder. She denies any tenderness to touch, no known injury, denies pain with certain movements.  She is not currently breastfeeding. States she does not drink much water, drinks mostly juice and sodas. She is supposed to schedule an appointment to resume care under her PCP, Dr. Cherly Hensen, but has not done so yet.   No Known Allergies No current outpatient medications on file. Past Medical History:  Diagnosis Date   Medical history non-contributory    No pertinent past medical history         Review of Systems  Constitutional: Negative.   Genitourinary: Positive for frequency.       See HPI  Musculoskeletal: Positive for back pain.       See HPI        Objective:   Physical Exam Vitals reviewed.  Constitutional:      General: She is not in acute distress.    Appearance: Normal appearance. She is obese.  HENT:     Head: Normocephalic and atraumatic.  Cardiovascular:     Rate and Rhythm: Normal rate and regular rhythm.     Heart sounds: Normal heart sounds.  Pulmonary:     Effort: Pulmonary effort is normal. No respiratory distress.     Breath sounds: Normal breath sounds.  Abdominal:     Tenderness: There is no right CVA tenderness or left CVA tenderness.  Musculoskeletal:        General: No signs of injury. Normal range of motion.     Cervical back: No bony  tenderness.     Thoracic back: No bony tenderness.     Lumbar back: No tenderness or bony tenderness.  Skin:    General: Skin is warm and dry.  Neurological:     Mental Status: She is alert and oriented to person, place, and time.  Psychiatric:        Mood and Affect: Mood normal.        Behavior: Behavior normal.    Today's Vitals   02/23/20 1310  BP: 116/82  Pulse: 86  SpO2: 99%  Weight: 266 lb (120.7 kg)  Height: 5\' 6"  (1.676 m)   Body mass index is 42.93 kg/m.  Results for orders placed or performed in visit on 02/23/20  Urinalysis Dipstick  Result Value Ref Range   Color, UA yellow    Clarity, UA     Glucose, UA Negative Negative   Bilirubin, UA Negative    Ketones, UA Negative    Spec Grav, UA 1.020 1.010 - 1.025   Blood, UA Negative    pH, UA 6.0 5.0 - 8.0   Protein, UA Negative Negative   Urobilinogen, UA 0.2 0.2 or 1.0 E.U./dL   Nitrite, UA Negative    Leukocytes, UA Negative Negative   Appearance     Odor  Assessment:     Urinary frequency - Plan: Urinalysis Dipstick      Plan:     1. Urine dipstick negative for infection, will send culture for verification. Discussed with pt that her low back pain as described is not located at the kidneys, she has not CVA tenderness. Recommend increasing her water intake and avoiding juice and sodas, as well as scheduled voiding to avoid an overfull bladder since this is when she notices the pain most often. If symptoms persist and her culture is negative recommend f/u. If symptoms worsen, develops fever, flank pain, hematuria, or any other concerning symptoms she should seek emergent care. Will notify pt of results.

## 2020-02-24 LAB — URINE CULTURE

## 2022-09-17 ENCOUNTER — Emergency Department (HOSPITAL_COMMUNITY): Payer: No Typology Code available for payment source

## 2022-09-17 ENCOUNTER — Emergency Department (HOSPITAL_COMMUNITY)
Admission: EM | Admit: 2022-09-17 | Discharge: 2022-09-17 | Disposition: A | Discharge status: Home / Self Care | Payer: No Typology Code available for payment source | Attending: Emergency Medicine | Admitting: Emergency Medicine

## 2022-09-17 ENCOUNTER — Other Ambulatory Visit: Payer: Self-pay

## 2022-09-17 DIAGNOSIS — I10 Essential (primary) hypertension: Secondary | ICD-10-CM | POA: Insufficient documentation

## 2022-09-17 DIAGNOSIS — R0602 Shortness of breath: Secondary | ICD-10-CM | POA: Insufficient documentation

## 2022-09-17 DIAGNOSIS — Z79899 Other long term (current) drug therapy: Secondary | ICD-10-CM | POA: Diagnosis not present

## 2022-09-17 DIAGNOSIS — Z1152 Encounter for screening for COVID-19: Secondary | ICD-10-CM | POA: Insufficient documentation

## 2022-09-17 DIAGNOSIS — R202 Paresthesia of skin: Secondary | ICD-10-CM | POA: Diagnosis not present

## 2022-09-17 DIAGNOSIS — R000 Tachycardia, unspecified: Secondary | ICD-10-CM | POA: Insufficient documentation

## 2022-09-17 LAB — CBC WITH DIFFERENTIAL/PLATELET
Abs Immature Granulocytes: 0.03 10*3/uL (ref 0.00–0.07)
Basophils Absolute: 0 10*3/uL (ref 0.0–0.1)
Basophils Relative: 0 %
Eosinophils Absolute: 0.2 10*3/uL (ref 0.0–0.5)
Eosinophils Relative: 3 %
HCT: 40.7 % (ref 36.0–46.0)
Hemoglobin: 13.3 g/dL (ref 12.0–15.0)
Immature Granulocytes: 0 %
Lymphocytes Relative: 10 %
Lymphs Abs: 0.8 10*3/uL (ref 0.7–4.0)
MCH: 29.8 pg (ref 26.0–34.0)
MCHC: 32.7 g/dL (ref 30.0–36.0)
MCV: 91.3 fL (ref 80.0–100.0)
Monocytes Absolute: 0.5 10*3/uL (ref 0.1–1.0)
Monocytes Relative: 6 %
Neutro Abs: 7 10*3/uL (ref 1.7–7.7)
Neutrophils Relative %: 81 %
Platelets: 263 10*3/uL (ref 150–400)
RBC: 4.46 MIL/uL (ref 3.87–5.11)
RDW: 12.4 % (ref 11.5–15.5)
WBC: 8.6 10*3/uL (ref 4.0–10.5)
nRBC: 0 % (ref 0.0–0.2)

## 2022-09-17 LAB — WET PREP, GENITAL
Clue Cells Wet Prep HPF POC: NONE SEEN
Sperm: NONE SEEN
Trich, Wet Prep: NONE SEEN
WBC, Wet Prep HPF POC: <10 (ref <10)
Yeast Wet Prep HPF POC: NONE SEEN

## 2022-09-17 LAB — URINALYSIS, W/ REFLEX TO CULTURE (INFECTION SUSPECTED)
Bacteria, UA: NONE SEEN
Bilirubin Urine: NEGATIVE
Glucose, UA: NEGATIVE mg/dL
Hgb urine dipstick: NEGATIVE
Ketones, ur: NEGATIVE mg/dL
Leukocytes,Ua: NEGATIVE
Nitrite: NEGATIVE
Protein, ur: NEGATIVE mg/dL
Specific Gravity, Urine: 1.012 (ref 1.005–1.030)
pH: 6 (ref 5.0–8.0)

## 2022-09-17 LAB — TSH: TSH: 0.242 u[IU]/mL — ABNORMAL LOW (ref 0.350–4.500)

## 2022-09-17 LAB — CK: Total CK: 71 U/L (ref 38–234)

## 2022-09-17 LAB — COMPREHENSIVE METABOLIC PANEL
ALT: 14 U/L (ref 0–44)
AST: 19 U/L (ref 15–41)
Albumin: 3.9 g/dL (ref 3.5–5.0)
Alkaline Phosphatase: 45 U/L (ref 38–126)
Anion gap: 9 (ref 5–15)
BUN: 8 mg/dL (ref 6–20)
CO2: 22 mmol/L (ref 22–32)
Calcium: 8.9 mg/dL (ref 8.9–10.3)
Chloride: 103 mmol/L (ref 98–111)
Creatinine, Ser: 0.76 mg/dL (ref 0.44–1.00)
GFR, Estimated: >60 mL/min (ref >60)
Glucose, Bld: 119 mg/dL — ABNORMAL HIGH (ref 70–99)
Potassium: 3.6 mmol/L (ref 3.5–5.1)
Sodium: 134 mmol/L — ABNORMAL LOW (ref 135–145)
Total Bilirubin: 0.8 mg/dL (ref 0.3–1.2)
Total Protein: 7.5 g/dL (ref 6.5–8.1)

## 2022-09-17 LAB — MAGNESIUM: Magnesium: 1.7 mg/dL (ref 1.7–2.4)

## 2022-09-17 LAB — PREGNANCY, URINE: Preg Test, Ur: NEGATIVE

## 2022-09-17 LAB — RESP PANEL BY RT-PCR (RSV, FLU A&B, COVID)  RVPGX2
Influenza A by PCR: NEGATIVE
Influenza B by PCR: NEGATIVE
Resp Syncytial Virus by PCR: NEGATIVE
SARS Coronavirus 2 by RT PCR: NEGATIVE

## 2022-09-17 LAB — CBG MONITORING, ED: Glucose-Capillary: 117 mg/dL — ABNORMAL HIGH (ref 70–99)

## 2022-09-17 LAB — TROPONIN I (HIGH SENSITIVITY): Troponin I (High Sensitivity): 3 ng/L (ref <18)

## 2022-09-17 LAB — T4, FREE: Free T4: 0.85 ng/dL (ref 0.61–1.12)

## 2022-09-17 MED ORDER — ACETAMINOPHEN 325 MG PO TABS
650.0000 mg | ORAL_TABLET | Freq: Once | ORAL | Status: AC
  Administered 2022-09-17: 650 mg via ORAL
  Filled 2022-09-17: qty 2

## 2022-09-17 MED ORDER — LISINOPRIL 10 MG PO TABS
5.0000 mg | ORAL_TABLET | Freq: Once | ORAL | Status: AC
  Administered 2022-09-17: 5 mg via ORAL
  Filled 2022-09-17: qty 1

## 2022-09-17 MED ORDER — LACTATED RINGERS IV BOLUS
1000.0000 mL | Freq: Once | INTRAVENOUS | Status: AC
  Administered 2022-09-17: 1000 mL via INTRAVENOUS

## 2022-09-17 NOTE — ED Notes (Signed)
Discharge instructions reviewed with patient. Patient's questions about care answered at this time. Patient denies any further questions or concerns. Patient ambulatory out to lobby.

## 2022-09-17 NOTE — Discharge Instructions (Addendum)
Your blood pressure and heart rate have normalized. Your lab work is all very reassuring.  The gonorrhea and Chlamydia testing takes 1 to 2 days.  You can follow-up on results of this through [facility] or through your PCP.  Return to the emergency department at anytime for any new or worsening symptoms of concern.

## 2022-09-17 NOTE — ED Triage Notes (Signed)
Pt presents to ED for rapid heart rate. Patient states she felt like her heart rate was high, states it was been 120s and 130s. Patient is having chest pain, tightening in her legs and arms and shortness of breath.

## 2022-09-17 NOTE — ED Provider Notes (Signed)
[facility] Provider Note   CSN: [id number] Arrival date & time: 09/17/22  0825     History  Chief Complaint  Patient presents with   Chest Pain   Shortness of Breath    Jaclyn Wright is a 52 y.o. female.   Chest Pain Associated symptoms: nausea, palpitations and shortness of breath   Shortness of Breath Patient presents for multiple complaints.  Her only known chronic condition is hypertension.  Currently, she takes a 5 mg dose of lisinopril daily.  She did not take her dose this morning.  Yesterday, she has nausea without vomiting.  Today, she has felt tightness in the back of her legs, palpitations, and shortness of breath.  Symptoms worsened while on her way to work.  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  For this reason, she presented to the ED.  Other recent symptoms include intermittent chills.  She was seen by her primary care doctor 3 weeks ago for chills and a cough.  Since that time, her cough has improved.  She does have intermittent headaches and takes Tylenol for these.  She has had recent urinary frequency and endorses a tingling sensation in her vaginal area.  She denies recent discharge but is concerned of STIs.  She currently denies any nausea.  She denies any areas of pain.  She does feel anxious.  She has been tearful lately, including this morning.  She does drink occasionally.  Last drink was 2 days ago.     Home Medications Prior to Admission medications   Not on File      Allergies    Patient has no known allergies.    Review of Systems   Review of Systems  Constitutional:  Positive for chills.  Respiratory:  Positive for shortness of breath.   Cardiovascular:  Positive for palpitations.  Gastrointestinal:  Positive for nausea.  Genitourinary:  Positive for frequency.  All other systems reviewed and are negative.   Physical Exam Updated Vital Signs BP 137/87   Pulse 86   Temp 98.2 F (36.8 C) (Oral)    Resp 14   Ht 5\' 6"  (1.676 m)   Wt 120.2 kg   SpO2 100%   BMI 42.77 kg/m  Physical Exam Vitals and nursing note reviewed.  Constitutional:      General: She is not in acute distress.    Appearance: She is well-developed. She is not ill-appearing, toxic-appearing or diaphoretic.  HENT:     Head: Normocephalic and atraumatic.  Eyes:     Conjunctiva/sclera: Conjunctivae normal.  Neck:     Vascular: No JVD.  Cardiovascular:     Rate and Rhythm: Regular rhythm. Tachycardia present.     Heart sounds: No murmur heard. Pulmonary:     Effort: Pulmonary effort is normal. No tachypnea or respiratory distress.     Breath sounds: No decreased breath sounds, wheezing, rhonchi or rales.  Chest:     Chest wall: No tenderness.  Abdominal:     Palpations: Abdomen is soft.     Tenderness: There is no abdominal tenderness.  Musculoskeletal:        General: No swelling. Normal range of motion.     Cervical back: Normal range of motion and neck supple.     Right lower leg: No edema.     Left lower leg: No edema.  Skin:    General: Skin is warm and dry.     Capillary Refill: Capillary refill takes  less than 2 seconds.     Coloration: Skin is not cyanotic or pale.  Neurological:     General: No focal deficit present.     Mental Status: She is alert and oriented to person, place, and time.  Psychiatric:        Mood and Affect: Mood is anxious. Affect is tearful.        Behavior: Behavior normal.     ED Results / Procedures / Treatments   Labs (all labs ordered are listed, but only abnormal results are displayed) Labs Reviewed  COMPREHENSIVE METABOLIC PANEL - Abnormal; Notable for the following components:      Result Value   Sodium 134 (*)    Glucose, Bld 119 (*)    All other components within normal limits  TSH - Abnormal; Notable for the following components:   TSH 0.242 (*)    All other components within normal limits  CBG MONITORING, ED - Abnormal; Notable for the following  components:   Glucose-Capillary 117 (*)    All other components within normal limits  RESP PANEL BY RT-PCR (RSV, FLU A&B, COVID)  RVPGX2  WET PREP, GENITAL  CBC WITH DIFFERENTIAL/PLATELET  PREGNANCY, URINE  URINALYSIS, W/ REFLEX TO CULTURE (INFECTION SUSPECTED)  MAGNESIUM  CK  T4, FREE  T3, FREE  GC/CHLAMYDIA PROBE AMP ([place]) NOT AT [facility]  TROPONIN I (HIGH SENSITIVITY)    EKG EKG Interpretation  Date/Time:  Monday September 17 2022 08:33:47 EST Ventricular Rate:  111 PR Interval:  143 QRS Duration: 84 QT Interval:  309 QTC Calculation: 420 R Axis:   47 Text Interpretation: Sinus tachycardia Confirmed by Godfrey Pick ([id number]) on 09/17/2022 9:19:20 AM  Radiology DG Chest Port 1 View  Result Date: 09/17/2022 CLINICAL DATA:  Shortness of breath and palpitations. EXAM: PORTABLE CHEST 1 VIEW COMPARISON:  Chest x-ray dated February 15, 2017. FINDINGS: The heart size and mediastinal contours are within normal limits. Both lungs are clear. The visualized skeletal structures are unremarkable. IMPRESSION: No active disease. Electronically Signed   By: Titus Dubin M.D.   On: 09/17/2022 09:21    Procedures Procedures    Medications Ordered in ED Medications  lactated ringers bolus 1,000 mL (0 mLs Intravenous Stopped 09/17/22 1046)  lisinopril (ZESTRIL) tablet 5 mg (5 mg Oral Given 09/17/22 0903)  acetaminophen (TYLENOL) tablet 650 mg (650 mg Oral Given 09/17/22 1244)    ED Course/ Medical Decision Making/ A&P                             Medical Decision Making Amount and/or Complexity of Data Reviewed Labs: ordered. Radiology: ordered.  Risk OTC drugs. Prescription drug management.   This patient presents to the ED for concern of multiple complaints, this involves an extensive number of treatment options, and is a complaint that carries with it a high risk of complications and morbidity.  The differential diagnosis includes dehydration, anxiety, URI, alcohol withdrawal, pneumonia,  ACS   Co morbidities that complicate the patient evaluation  HTN   Additional history obtained:  Additional history obtained from N/A External records from outside source obtained and reviewed including EMR   Lab Tests:  I Ordered, and personally interpreted labs.  The pertinent results include: Normal kidney function, normal electrolytes, normal hemoglobin, no leukocytosis, normal troponin.  TSH is low but T4 is normal.   Imaging Studies ordered:  I ordered imaging studies including chest x-ray I independently visualized and interpreted  imaging which showed no acute findings I agree with the radiologist interpretation   Cardiac Monitoring: / EKG:  The patient was maintained on a cardiac monitor.  I personally viewed and interpreted the cardiac monitored which showed an underlying rhythm of: Sinus rhythm   Problem List / ED Course / Critical interventions / Medication management  Patient presents for multiple complaints.  Vital signs on arrival are notable for hypertension and tachycardia.  She did not take her morning lisinopril.  Initial heart rate was in the range of 120.  After patient got settled, heart rate improved to 105 without intervention.  She does endorse recent urinary frequency.  She denies any other fluid losses.  She is well-appearing on exam, although anxious and tearful.  IV fluids and laboratory workup were initiated.  Patient does request pelvic exam as well for testing of STIs.  Following IV fluids, patient had normalization of heart rate.  During her prolonged ED observation, heart rate remained in the 70s.  Blood pressure normalized as well following her home dose of lisinopril.  Laboratory workup is reassuring.  Patient underwent pelvic exam, the findings of which were consistent with STI.  Wet prep was negative.  She was advised to follow-up on results of GC chlamydia testing.  She was discharged in good condition. I ordered medication including IV fluids  for hydration; Tylenol for analgesia; lisinopril for hypertension Reevaluation of the patient after these medicines showed that the patient resolved I have reviewed the patients home medicines and have made adjustments as needed   Social Determinants of Health:  Has access to outpatient care          Final Clinical Impression(s) / ED Diagnoses Final diagnoses:  Shortness of breath  Tachycardia    Rx / DC Orders ED Discharge Orders     None         Godfrey Pick, MD 09/17/22 [phone]

## 2022-09-18 LAB — GC/CHLAMYDIA PROBE AMP (~~LOC~~) NOT AT ARMC
Chlamydia: NEGATIVE
Comment: NEGATIVE
Comment: NORMAL
Neisseria Gonorrhea: NEGATIVE

## 2022-09-18 LAB — T3, FREE: T3, Free: 2.2 pg/mL (ref 2.0–4.4)
